# Patient Record
Sex: Female | Born: 1990 | Race: Asian | Hispanic: No | Marital: Single | Smoking: Never smoker
Health system: Southern US, Community
[De-identification: ages and names within clinical notes are randomized; demographics above are authoritative.]

## PROBLEM LIST (undated history)

## (undated) ENCOUNTER — Inpatient Hospital Stay (HOSPITAL_COMMUNITY): Payer: Self-pay

## (undated) DIAGNOSIS — D649 Anemia, unspecified: Secondary | ICD-10-CM

## (undated) HISTORY — PX: NO PAST SURGERIES: SHX2092

---

## 2013-07-09 NOTE — L&D Delivery Note (Signed)
Delivery Note At 12:55 AM a viable and healthy female was delivered via Vaginal, Spontaneous Delivery (Presentation: Left Occiput Anterior).  APGAR: 9, 9; weight: pending .   Placenta status: Intact, Spontaneous.  Cord: 3 vessels with the following complications: None.    Anesthesia: Epidural  Episiotomy: None Lacerations: None Suture Repair: N/A Est. Blood Loss (mL): 200  Mom to postpartum.  Baby to Couplet care / Skin to Skin.  Benjamin Stainhompson, McKenzie L, MD 06/30/2014, 1:16 AM  I have seen and examined this patient and I agree with the above. Cam HaiSHAW, Zuley Lutter CNM 9:51 AM 06/30/2014

## 2013-08-29 ENCOUNTER — Encounter (HOSPITAL_COMMUNITY): Payer: Self-pay | Admitting: *Deleted

## 2013-08-29 ENCOUNTER — Inpatient Hospital Stay (HOSPITAL_COMMUNITY)
Admission: AD | Admit: 2013-08-29 | Discharge: 2013-08-29 | Disposition: A | Discharge status: Home / Self Care | Payer: Self-pay | Source: Ambulatory Visit | Attending: Obstetrics & Gynecology | Admitting: Obstetrics & Gynecology

## 2013-08-29 DIAGNOSIS — N925 Other specified irregular menstruation: Secondary | ICD-10-CM | POA: Insufficient documentation

## 2013-08-29 DIAGNOSIS — N938 Other specified abnormal uterine and vaginal bleeding: Secondary | ICD-10-CM | POA: Insufficient documentation

## 2013-08-29 DIAGNOSIS — N939 Abnormal uterine and vaginal bleeding, unspecified: Secondary | ICD-10-CM

## 2013-08-29 DIAGNOSIS — D649 Anemia, unspecified: Secondary | ICD-10-CM

## 2013-08-29 DIAGNOSIS — N949 Unspecified condition associated with female genital organs and menstrual cycle: Secondary | ICD-10-CM | POA: Insufficient documentation

## 2013-08-29 DIAGNOSIS — N898 Other specified noninflammatory disorders of vagina: Secondary | ICD-10-CM

## 2013-08-29 HISTORY — DX: Anemia, unspecified: D64.9

## 2013-08-29 LAB — URINALYSIS, ROUTINE W REFLEX MICROSCOPIC
BILIRUBIN URINE: NEGATIVE
Glucose, UA: NEGATIVE mg/dL
Ketones, ur: NEGATIVE mg/dL
LEUKOCYTES UA: NEGATIVE
NITRITE: NEGATIVE
Protein, ur: NEGATIVE mg/dL
SPECIFIC GRAVITY, URINE: 1.02 (ref 1.005–1.030)
Urobilinogen, UA: 0.2 mg/dL (ref 0.0–1.0)
pH: 6 (ref 5.0–8.0)

## 2013-08-29 LAB — WET PREP, GENITAL
Clue Cells Wet Prep HPF POC: NONE SEEN
Trich, Wet Prep: NONE SEEN
Yeast Wet Prep HPF POC: NONE SEEN

## 2013-08-29 LAB — URINE MICROSCOPIC-ADD ON

## 2013-08-29 LAB — CBC
HCT: 30.3 % — ABNORMAL LOW (ref 36.0–46.0)
HEMOGLOBIN: 9.2 g/dL — AB (ref 12.0–15.0)
MCH: 21.6 pg — ABNORMAL LOW (ref 26.0–34.0)
MCHC: 30.4 g/dL (ref 30.0–36.0)
MCV: 71.1 fL — ABNORMAL LOW (ref 78.0–100.0)
Platelets: 302 10*3/uL (ref 150–400)
RBC: 4.26 MIL/uL (ref 3.87–5.11)
RDW: 16.1 % — ABNORMAL HIGH (ref 11.5–15.5)
WBC: 10.6 10*3/uL — ABNORMAL HIGH (ref 4.0–10.5)

## 2013-08-29 LAB — POCT PREGNANCY, URINE: PREG TEST UR: NEGATIVE

## 2013-08-29 MED ORDER — NORGESTIMATE-ETH ESTRADIOL 0.25-35 MG-MCG PO TABS: 1.0000 | ORAL_TABLET | Freq: Every day | ORAL | Status: DC

## 2013-08-29 NOTE — Discharge Instructions (Signed)
Abnormal Uterine Bleeding °Abnormal uterine bleeding means bleeding from the vagina that is not your normal menstrual period. This can be: °· Bleeding or spotting between periods. °· Bleeding after sex (sexual intercourse). °· Bleeding that is heavier or more than normal. °· Periods that last longer than usual. °· Bleeding after menopause. °There are many problems that may cause this. Treatment will depend on the cause of the bleeding. Any kind of bleeding that is not normal should be reviewed by your doctor.  °HOME CARE °Watch your condition for any changes. These actions may lessen any discomfort you are having: °· Do not use tampons or douches as told by your doctor. °· Change your pads often. °You should get regular pelvic exams and Pap tests. Keep all appointments for tests as told by your doctor. °GET HELP IF: °· You are bleeding for more than 1 week. °· You feel dizzy at times. °GET HELP RIGHT AWAY IF:  °· You pass out. °· You have to change pads every 15 to 30 minutes. °· You have belly pain. °· You have a fever. °· You become sweaty or weak. °· You are passing large blood clots from the vagina. °· You feel sick to your stomach (nauseous) and throw up (vomit). °MAKE SURE YOU: °· Understand these instructions. °· Will watch your condition. °· Will get help right away if you are not doing well or get worse. °Document Released: 04/22/2009 Document Revised: 04/15/2013 Document Reviewed: 01/22/2013 °ExitCare® Patient Information ©2014 [employer]. ° °Anemia, Nonspecific °Anemia is a condition in which the concentration of red blood cells or hemoglobin in the blood is below normal. Hemoglobin is a substance in red blood cells that carries oxygen to the tissues of the body. Anemia results in not enough oxygen reaching these tissues.  °CAUSES  °Common causes of anemia include:  °· Excessive bleeding. Bleeding may be internal or external. This includes excessive bleeding from periods (in women) or from the  intestine.   °· Poor nutrition.   °· Chronic kidney, thyroid, and liver disease.  °· Bone marrow disorders that decrease red blood cell production. °· Cancer and treatments for cancer. °· HIV, AIDS, and their treatments. °· Spleen problems that increase red blood cell destruction. °· Blood disorders. °· Excess destruction of red blood cells due to infection, medicines, and autoimmune disorders. °SIGNS AND SYMPTOMS  °· Minor weakness.   °· Dizziness.   °· Headache. °· Palpitations.   °· Shortness of breath, especially with exercise.   °· Paleness. °· Cold sensitivity. °· Indigestion. °· Nausea. °· Difficulty sleeping. °· Difficulty concentrating. °Symptoms may occur suddenly or they may develop slowly.  °DIAGNOSIS  °Additional blood tests are often needed. These help your health care provider determine the best treatment. Your health care provider will check your stool for blood and look for other causes of blood loss.  °TREATMENT  °Treatment varies depending on the cause of the anemia. Treatment can include:  °· Supplements of iron, vitamin B12, or folic acid.   °· Hormone medicines.   °· A blood transfusion. This may be needed if blood loss is severe.   °· Hospitalization. This may be needed if there is significant continual blood loss.   °· Dietary changes. °· Spleen removal. °HOME CARE INSTRUCTIONS °Keep all follow-up appointments. It often takes many weeks to correct anemia, and having your health care provider check on your condition and your response to treatment is very important. °SEEK IMMEDIATE MEDICAL CARE IF:  °· You develop extreme weakness, shortness of breath, or chest pain.   °· You become   dizzy or have trouble concentrating. °· You develop heavy vaginal bleeding.   °· You develop a rash.   °· You have bloody or black, tarry stools.   °· You faint.   °· You vomit up blood.   °· You vomit repeatedly.   °· You have abdominal pain. °· You have a fever or persistent symptoms for more than 2 3 days.    °· You have a fever and your symptoms suddenly get worse.   °· You are dehydrated.   °MAKE SURE YOU: °· Understand these instructions. °· Will watch your condition. °· Will get help right away if you are not doing well or get worse. °Document Released: 08/02/2004 Document Revised: 02/25/2013 Document Reviewed: 12/19/2012 °ExitCare® Patient Information ©2014 [employer]. ° °

## 2013-08-29 NOTE — MAU Note (Signed)
Patient presents with complaint of vaginal bleeding X 3 weeks.

## 2013-08-29 NOTE — MAU Provider Note (Signed)
Attestation of Attending Supervision of Advanced Practitioner (CNM/NP): Evaluation and management procedures were performed by the Advanced Practitioner under my supervision and collaboration.  I have reviewed the Advanced Practitioner's note and chart, and I agree with the management and plan.  HARRAWAY-SMITH, Stevin Bielinski 7:55 PM

## 2013-08-29 NOTE — MAU Provider Note (Signed)
History     CSN: [id number]  Arrival date and time: 08/29/13 1558   First Provider Initiated Contact with Patient 08/29/13 1723      Chief Complaint  Patient presents with  . Vaginal Bleeding   HPI  [name] is a 23 y.o. female G1P1001 who presents with abnormal vaginal bleeding. The bleeding started 3 weeks ago; one week before she was supposed to start her menstrual cycle and has not stopped.  The bleeding is very light; only using 2 panty liners per day. She has never experienced this problems before. She has never used anything for birth control, however desires birth control pills and is waiting to receive health insurance. Hx of anemia.    OB History   Grav Para Term Preterm Abortions TAB SAB Ect Mult Living   1 1 1       1       Past Medical History  Diagnosis Date  . Anemia     Past Surgical History  Procedure Laterality Date  . No past surgeries      History reviewed. No pertinent family history.  History  Substance Use Topics  . Smoking status: Never Smoker   . Smokeless tobacco: Never Used  . Alcohol Use: No    Allergies: No Known Allergies  No prescriptions prior to admission   Results for orders placed during the hospital encounter of 08/29/13 (from the past 48 hour(s))  URINALYSIS, ROUTINE W REFLEX MICROSCOPIC     Status: Abnormal   Collection Time    08/29/13  4:10 PM      Result Value Ref Range   Color, Urine YELLOW  YELLOW   APPearance CLEAR  CLEAR   Specific Gravity, Urine 1.020  1.005 - 1.030   pH 6.0  5.0 - 8.0   Glucose, UA NEGATIVE  NEGATIVE mg/dL   Hgb urine dipstick LARGE (*) NEGATIVE   Bilirubin Urine NEGATIVE  NEGATIVE   Ketones, ur NEGATIVE  NEGATIVE mg/dL   Protein, ur NEGATIVE  NEGATIVE mg/dL   Urobilinogen, UA 0.2  0.0 - 1.0 mg/dL   Nitrite NEGATIVE  NEGATIVE   Leukocytes, UA NEGATIVE  NEGATIVE  URINE MICROSCOPIC-ADD ON     Status: Abnormal   Collection Time    08/29/13  4:10 PM      Result Value Ref Range    Squamous Epithelial / LPF FEW (*) RARE   WBC, UA 0-2  <3 WBC/hpf   RBC / HPF 21-50  <3 RBC/hpf   Bacteria, UA FEW (*) RARE  POCT PREGNANCY, URINE     Status: None   Collection Time    08/29/13  4:49 PM      Result Value Ref Range   Preg Test, Ur NEGATIVE  NEGATIVE   Comment:            THE SENSITIVITY OF THIS     METHODOLOGY IS >24 mIU/mL  CBC     Status: Abnormal   Collection Time    08/29/13  4:56 PM      Result Value Ref Range   WBC 10.6 (*) 4.0 - 10.5 K/uL   RBC 4.26  3.87 - 5.11 MIL/uL   Hemoglobin 9.2 (*) 12.0 - 15.0 g/dL   HCT 40.9 (*) 81.1 - 91.4 %   MCV 71.1 (*) 78.0 - 100.0 fL   MCH 21.6 (*) 26.0 - 34.0 pg   MCHC 30.4  30.0 - 36.0 g/dL   RDW 78.2 (*) 95.6 - 21.3 %  Platelets 302  150 - 400 K/uL  WET PREP, GENITAL     Status: Abnormal   Collection Time    08/29/13  5:41 PM      Result Value Ref Range   Yeast Wet Prep HPF POC NONE SEEN  NONE SEEN   Trich, Wet Prep NONE SEEN  NONE SEEN   Clue Cells Wet Prep HPF POC NONE SEEN  NONE SEEN   WBC, Wet Prep HPF POC FEW (*) NONE SEEN   Comment: FEW BACTERIA SEEN       Review of Systems  Constitutional: Negative for fever and chills.  Gastrointestinal: Positive for abdominal pain (+ abdominal cramping ).  Genitourinary:       No vaginal discharge. + vaginal bleeding; dark red in color, small amount  No dysuria.    Physical Exam   Blood pressure 117/81, pulse 84, temperature 98.1 F (36.7 C), temperature source Oral, resp. rate 16, height 5\' 2"  (1.575 m), weight 56.756 kg (125 lb 2 oz), last menstrual period 07/12/2013.  Physical Exam  Constitutional: She is oriented to person, place, and time. She appears well-developed and well-nourished. No distress.  HENT:  Head: Normocephalic.  Eyes: Pupils are equal, round, and reactive to light.  Neck: Neck supple.  Respiratory: Effort normal.  GI: Soft. She exhibits no distension and no mass. There is no tenderness. There is no rebound and no guarding.   Genitourinary:  Speculum exam: Vagina - Small amount of dark red vaginal blood in canal. A few pea size clots noted  Cervix - Small active bleeding  Bimanual exam: Cervix closed, no CMT Uterus non tender, normal size Adnexa non tender, no masses bilaterally GC/Chlam, wet prep done Chaperone present for exam.   Musculoskeletal: Normal range of motion.  Neurological: She is alert and oriented to person, place, and time.  Skin: Skin is warm. She is not diaphoretic.  Psychiatric: Her behavior is normal.    MAU Course  Procedures None  MDM CBC Urine preg UA Wet prep GC Pt does not smoke, no history of DVT  Assessment and Plan   A:  1. Abnormal vaginal bleeding   2. Anemia     P: Discharge home in stable condition Follow up in the clinic in 3-6 months; referral sent  Start taking a daily vitamin with iron as directed on the bottle Bleeding precautions discussed Return to MAU as needed, if symptoms worsen RX: sprintec  Follow up in the clinic in 3-6 months for reevaluation of bleeding   Julie HansenJennifer Irene Nicklous Aburto, NP  08/29/2013, 6:12 PM

## 2013-08-31 LAB — GC/CHLAMYDIA PROBE AMP
CT Probe RNA: NEGATIVE
GC Probe RNA: NEGATIVE

## 2013-11-26 ENCOUNTER — Encounter: Payer: Self-pay | Admitting: Obstetrics & Gynecology

## 2013-11-26 ENCOUNTER — Telehealth: Payer: Self-pay | Admitting: General Practice

## 2013-11-26 ENCOUNTER — Encounter: Payer: Self-pay | Admitting: General Practice

## 2013-11-26 NOTE — Telephone Encounter (Signed)
Patient no showed for appt. Called patient, no answer- left message that we are trying to reach you because we see you missed your appt today, please give our front office staff a call to reschedule this appt. Will send letter

## 2014-01-18 ENCOUNTER — Inpatient Hospital Stay (HOSPITAL_COMMUNITY): Payer: Self-pay

## 2014-01-18 ENCOUNTER — Inpatient Hospital Stay (HOSPITAL_COMMUNITY)
Admission: AD | Admit: 2014-01-18 | Discharge: 2014-01-18 | Disposition: A | Discharge status: Home / Self Care | Payer: Self-pay | Source: Ambulatory Visit | Attending: Family Medicine | Admitting: Family Medicine

## 2014-01-18 ENCOUNTER — Encounter (HOSPITAL_COMMUNITY): Payer: Self-pay | Admitting: *Deleted

## 2014-01-18 DIAGNOSIS — M62838 Other muscle spasm: Secondary | ICD-10-CM | POA: Insufficient documentation

## 2014-01-18 DIAGNOSIS — O0930 Supervision of pregnancy with insufficient antenatal care, unspecified trimester: Secondary | ICD-10-CM | POA: Insufficient documentation

## 2014-01-18 DIAGNOSIS — Z3490 Encounter for supervision of normal pregnancy, unspecified, unspecified trimester: Secondary | ICD-10-CM

## 2014-01-18 DIAGNOSIS — O9989 Other specified diseases and conditions complicating pregnancy, childbirth and the puerperium: Principal | ICD-10-CM

## 2014-01-18 DIAGNOSIS — O99891 Other specified diseases and conditions complicating pregnancy: Secondary | ICD-10-CM | POA: Insufficient documentation

## 2014-01-18 DIAGNOSIS — O99019 Anemia complicating pregnancy, unspecified trimester: Secondary | ICD-10-CM | POA: Insufficient documentation

## 2014-01-18 DIAGNOSIS — Y92410 Unspecified street and highway as the place of occurrence of the external cause: Secondary | ICD-10-CM | POA: Insufficient documentation

## 2014-01-18 DIAGNOSIS — D649 Anemia, unspecified: Secondary | ICD-10-CM | POA: Insufficient documentation

## 2014-01-18 LAB — WET PREP, GENITAL
TRICH WET PREP: NONE SEEN
YEAST WET PREP: NONE SEEN

## 2014-01-18 MED ORDER — CYCLOBENZAPRINE HCL 10 MG PO TABS: 10.0000 mg | ORAL_TABLET | Freq: Two times a day (BID) | ORAL | Status: DC | PRN
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
## 2014-01-18 NOTE — Discharge Instructions (Signed)

## 2014-01-19 LAB — GC/CHLAMYDIA PROBE AMP
CT PROBE, AMP APTIMA: NEGATIVE
GC Probe RNA: NEGATIVE

## 2014-01-20 NOTE — MAU Provider Note (Signed)
Attestation of Attending Supervision of Advanced Practitioner (PA/CNM/NP): Evaluation and management procedures were performed by the Advanced Practitioner under my supervision and collaboration.  I have reviewed the Advanced Practitioner's note and chart, and I agree with the management and plan.  Faydra Korman S, MD [facility] Attending 01/20/2014 10:13 AM   

## 2014-02-12 ENCOUNTER — Encounter (HOSPITAL_COMMUNITY): Payer: Self-pay | Admitting: *Deleted

## 2014-02-12 ENCOUNTER — Inpatient Hospital Stay (HOSPITAL_COMMUNITY)
Admission: AD | Admit: 2014-02-12 | Discharge: 2014-02-12 | Disposition: A | Discharge status: Home / Self Care | Payer: Self-pay | Source: Ambulatory Visit | Attending: Obstetrics & Gynecology | Admitting: Obstetrics & Gynecology

## 2014-02-12 DIAGNOSIS — R21 Rash and other nonspecific skin eruption: Secondary | ICD-10-CM | POA: Insufficient documentation

## 2014-02-12 DIAGNOSIS — R109 Unspecified abdominal pain: Secondary | ICD-10-CM | POA: Insufficient documentation

## 2014-02-12 DIAGNOSIS — O99891 Other specified diseases and conditions complicating pregnancy: Secondary | ICD-10-CM | POA: Insufficient documentation

## 2014-02-12 DIAGNOSIS — O9989 Other specified diseases and conditions complicating pregnancy, childbirth and the puerperium: Principal | ICD-10-CM

## 2014-02-12 DIAGNOSIS — R1032 Left lower quadrant pain: Secondary | ICD-10-CM

## 2014-02-12 DIAGNOSIS — R1031 Right lower quadrant pain: Secondary | ICD-10-CM

## 2014-02-12 LAB — URINALYSIS, ROUTINE W REFLEX MICROSCOPIC
BILIRUBIN URINE: NEGATIVE
Glucose, UA: NEGATIVE mg/dL
HGB URINE DIPSTICK: NEGATIVE
Ketones, ur: NEGATIVE mg/dL
Nitrite: NEGATIVE
PROTEIN: NEGATIVE mg/dL
Specific Gravity, Urine: 1.02 (ref 1.005–1.030)
Urobilinogen, UA: 0.2 mg/dL (ref 0.0–1.0)
pH: 6 (ref 5.0–8.0)

## 2014-02-12 LAB — URINE MICROSCOPIC-ADD ON

## 2014-02-12 NOTE — Progress Notes (Signed)
Jennifer Rasch NP in earlier to discuss test results and d/c plan. WRitten and verbal d/c instructions given and understanding voiced. 

## 2014-02-12 NOTE — Discharge Instructions (Signed)
Abdominal Pain During Pregnancy Abdominal pain is common in pregnancy. Most of the time, it does not cause harm. There are many causes of abdominal pain. Some causes are more serious than others. Some of the causes of abdominal pain in pregnancy are easily diagnosed. Occasionally, the diagnosis takes time to understand. Other times, the cause is not determined. Abdominal pain can be a sign that something is very wrong with the pregnancy, or the pain may have nothing to do with the pregnancy at all. For this reason, always tell your health care provider if you have any abdominal discomfort. HOME CARE INSTRUCTIONS  Monitor your abdominal pain for any changes. The following actions may help to alleviate any discomfort you are experiencing:  Do not have sexual intercourse or put anything in your vagina until your symptoms go away completely.  Get plenty of rest until your pain improves.  Drink clear fluids if you feel nauseous. Avoid solid food as long as you are uncomfortable or nauseous.  Only take over-the-counter or prescription medicine as directed by your health care provider.  Keep all follow-up appointments with your health care provider. SEEK IMMEDIATE MEDICAL CARE IF:  You are bleeding, leaking fluid, or passing tissue from the vagina.  You have increasing pain or cramping.  You have persistent vomiting.  You have painful or bloody urination.  You have a fever.  You notice a decrease in your baby's movements.  You have extreme weakness or feel faint.  You have shortness of breath, with or without abdominal pain.  You develop a severe headache with abdominal pain.  You have abnormal vaginal discharge with abdominal pain.  You have persistent diarrhea.  You have abdominal pain that continues even after rest, or gets worse. MAKE SURE YOU:   Understand these instructions.  Will watch your condition.  Will get help right away if you are not doing well or get  worse. Document Released: 06/25/2005 Document Revised: 04/15/2013 Document Reviewed: 01/22/2013 [facility] Patient Information 2015 [place], [place]. This information is not intended to replace advice given to you by your health care provider. Make sure you discuss any questions you have with your health care provider.  For assistance with applying for medicaid please call Fausto SkillernReyna South [phone] or Anne HahnJuanita McCraw [phone].

## 2014-02-12 NOTE — MAU Provider Note (Signed)
History     CSN: [id number]  Arrival date and time: 02/12/14 1950   None     Chief Complaint  Patient presents with  . Groin Pain  . Numbness   HPI  Julie Strong is a 23 y.o. female G2P1001 at 1422w0d who presents with complaints of bilateral groin pain; she notices the pain more at night when she is trying to go to sleep. She currently denies pain. She denies difficulty walking or changing positions.  Pt also complains of a rash that is under her arms; the rash is red and is itchy. She first noticed the rash 1 week ago. She feels like her body temperature has changed since she became pregnant and feels like she becomes hot and diaphoretic at night.  She has not started prenatal care because she does not have transportation.  Pt desires ultrasound today because she has not had care for the baby.   OB History   Grav Para Term Preterm Abortions TAB SAB Ect Mult Living   2 1 1       1       Past Medical History  Diagnosis Date  . Anemia     Past Surgical History  Procedure Laterality Date  . No past surgeries      Family History  Problem Relation Age of Onset  . Alcohol abuse Neg Hx     History  Substance Use Topics  . Smoking status: Never Smoker   . Smokeless tobacco: Never Used  . Alcohol Use: No    Allergies: No Known Allergies  Prescriptions prior to admission  Medication Sig Dispense Refill  . Prenatal Vit-Fe Fumarate-FA (PRENATAL MULTIVITAMIN) TABS tablet Take 1 tablet by mouth daily.        Results for orders placed during the hospital encounter of 02/12/14 (from the past 48 hour(s))  URINALYSIS, ROUTINE W REFLEX MICROSCOPIC     Status: Abnormal   Collection Time    02/12/14  8:20 PM      Result Value Ref Range   Color, Urine YELLOW  YELLOW   APPearance CLEAR  CLEAR   Specific Gravity, Urine 1.020  1.005 - 1.030   pH 6.0  5.0 - 8.0   Glucose, UA NEGATIVE  NEGATIVE mg/dL   Hgb urine dipstick NEGATIVE  NEGATIVE   Bilirubin Urine NEGATIVE  NEGATIVE    Ketones, ur NEGATIVE  NEGATIVE mg/dL   Protein, ur NEGATIVE  NEGATIVE mg/dL   Urobilinogen, UA 0.2  0.0 - 1.0 mg/dL   Nitrite NEGATIVE  NEGATIVE   Leukocytes, UA MODERATE (*) NEGATIVE  URINE MICROSCOPIC-ADD ON     Status: Abnormal   Collection Time    02/12/14  8:20 PM      Result Value Ref Range   Squamous Epithelial / LPF FEW (*) RARE   WBC, UA 7-10  <3 WBC/hpf   RBC / HPF 0-2  <3 RBC/hpf   Bacteria, UA FEW (*) RARE    Review of Systems  Constitutional: Negative for fever and chills.  Eyes: Negative for blurred vision.  Cardiovascular: Negative for chest pain.  Gastrointestinal: Positive for constipation. Negative for nausea, vomiting, abdominal pain and diarrhea.  Genitourinary: Negative for dysuria, urgency, frequency and hematuria.       Denies vaginal bleeding   Skin: Positive for rash.       Under both arms "in armpit area"  Neurological: Negative for headaches.   Physical Exam   Blood pressure 110/65, pulse 69, temperature 98.8 F (37.1  C), resp. rate 18, height 5\' 1"  (1.549 m), weight 60.419 kg (133 lb 3.2 oz), last menstrual period 07/12/2013.  Physical Exam  Constitutional: She is oriented to person, place, and time. She appears well-developed and well-nourished. No distress.  HENT:  Head: Normocephalic.  Eyes: Pupils are equal, round, and reactive to light.  Neck: Neck supple.  Cardiovascular: Intact distal pulses.   Respiratory: Effort normal.  GI: Soft. Normal appearance. There is no tenderness.  Musculoskeletal: Normal range of motion. She exhibits no edema and no tenderness.  Neurological: She is alert and oriented to person, place, and time. She has normal strength and normal reflexes. GCS eye subscore is 4. GCS verbal subscore is 5. GCS motor subscore is 6.  Skin: Skin is warm and dry. Rash (Red, non papular rash under bilateral arm pit areas. Non tender to touch ) noted. She is not diaphoretic. There is erythema.       MAU Course   Procedures None  MDM + Fht's  UA   Assessment and Plan   A:  1. Rash   2. Bilateral groin pain; likely round ligament pain    P:  Discharge home in stable condition  Abdominal support belt recommended 1% hydrocortisone cream to affected area as directed on the bottle.  Start prenatal care ASAP; Clinic phone number provided to the patient  Return to MAU if symptoms worsen Heat/ cold to groin sites.   Iona Hansen Rasch, NP  02/17/2014, 3:28 PM

## 2014-02-12 NOTE — MAU Note (Signed)
Pain in groin and causes numbness in legs. Get really hot at night and have rash on legs and upper arms. Some on sides. Sometimes rash itches. Rash present arms and legs for a wk

## 2014-02-15 ENCOUNTER — Inpatient Hospital Stay (HOSPITAL_COMMUNITY)
Admission: AD | Admit: 2014-02-15 | Discharge: 2014-02-15 | Disposition: A | Discharge status: Home / Self Care | Payer: Self-pay | Source: Ambulatory Visit | Attending: Obstetrics & Gynecology | Admitting: Obstetrics & Gynecology

## 2014-02-15 ENCOUNTER — Encounter (HOSPITAL_COMMUNITY): Payer: Self-pay | Admitting: General Practice

## 2014-02-15 DIAGNOSIS — O2390 Unspecified genitourinary tract infection in pregnancy, unspecified trimester: Secondary | ICD-10-CM | POA: Insufficient documentation

## 2014-02-15 DIAGNOSIS — B9689 Other specified bacterial agents as the cause of diseases classified elsewhere: Secondary | ICD-10-CM | POA: Insufficient documentation

## 2014-02-15 DIAGNOSIS — M549 Dorsalgia, unspecified: Secondary | ICD-10-CM | POA: Insufficient documentation

## 2014-02-15 DIAGNOSIS — A499 Bacterial infection, unspecified: Secondary | ICD-10-CM

## 2014-02-15 DIAGNOSIS — O209 Hemorrhage in early pregnancy, unspecified: Secondary | ICD-10-CM | POA: Insufficient documentation

## 2014-02-15 DIAGNOSIS — R109 Unspecified abdominal pain: Secondary | ICD-10-CM | POA: Insufficient documentation

## 2014-02-15 DIAGNOSIS — Z3482 Encounter for supervision of other normal pregnancy, second trimester: Secondary | ICD-10-CM

## 2014-02-15 DIAGNOSIS — N760 Acute vaginitis: Secondary | ICD-10-CM

## 2014-02-15 LAB — URINALYSIS, ROUTINE W REFLEX MICROSCOPIC
Bilirubin Urine: NEGATIVE
Glucose, UA: NEGATIVE mg/dL
Hgb urine dipstick: NEGATIVE
Ketones, ur: NEGATIVE mg/dL
LEUKOCYTES UA: NEGATIVE
NITRITE: NEGATIVE
PROTEIN: NEGATIVE mg/dL
Specific Gravity, Urine: 1.015 (ref 1.005–1.030)
Urobilinogen, UA: 0.2 mg/dL (ref 0.0–1.0)
pH: 7 (ref 5.0–8.0)

## 2014-02-15 LAB — WET PREP, GENITAL
Trich, Wet Prep: NONE SEEN
Yeast Wet Prep HPF POC: NONE SEEN

## 2014-02-15 MED ORDER — METRONIDAZOLE 500 MG PO TABS: 500.0000 mg | ORAL_TABLET | Freq: Two times a day (BID) | ORAL | Status: DC

## 2014-02-15 NOTE — Discharge Instructions (Signed)
Bacterial Vaginosis Bacterial vaginosis is a vaginal infection that occurs when the normal balance of bacteria in the vagina is disrupted. It results from an overgrowth of certain bacteria. This is the most common vaginal infection in women of childbearing age. Treatment is important to prevent complications, especially in pregnant women, as it can cause a premature delivery. CAUSES  Bacterial vaginosis is caused by an increase in harmful bacteria that are normally present in smaller amounts in the vagina. Several different kinds of bacteria can cause bacterial vaginosis. However, the reason that the condition develops is not fully understood. RISK FACTORS Certain activities or behaviors can put you at an increased risk of developing bacterial vaginosis, including:  Having a new sex partner or multiple sex partners.  Douching.  Using an intrauterine device (IUD) for contraception. Women do not get bacterial vaginosis from toilet seats, bedding, swimming pools, or contact with objects around them. SIGNS AND SYMPTOMS  Some women with bacterial vaginosis have no signs or symptoms. Common symptoms include:  Grey vaginal discharge.  A fishlike odor with discharge, especially after sexual intercourse.  Itching or burning of the vagina and vulva.  Burning or pain with urination. DIAGNOSIS  Your health care provider will take a medical history and examine the vagina for signs of bacterial vaginosis. A sample of vaginal fluid may be taken. Your health care provider will look at this sample under a microscope to check for bacteria and abnormal cells. A vaginal pH test may also be done.  TREATMENT  Bacterial vaginosis may be treated with antibiotic medicines. These may be given in the form of a pill or a vaginal cream. A second round of antibiotics may be prescribed if the condition comes back after treatment.  HOME CARE INSTRUCTIONS   Only take over-the-counter or prescription medicines as  directed by your health care provider.  If antibiotic medicine was prescribed, take it as directed. Make sure you finish it even if you start to feel better.  Do not have sex until treatment is completed.  Tell all sexual partners that you have a vaginal infection. They should see their health care provider and be treated if they have problems, such as a mild rash or itching.  Practice safe sex by using condoms and only having one sex partner. SEEK MEDICAL CARE IF:   Your symptoms are not improving after 3 days of treatment.  You have increased discharge or pain.  You have a fever. MAKE SURE YOU:   Understand these instructions.  Will watch your condition.  Will get help right away if you are not doing well or get worse. FOR MORE INFORMATION  Centers for Disease Control and Prevention, Division of STD Prevention: www.cdc.gov/std American Sexual Health Association (ASHA): www.ashastd.org  Document Released: 06/25/2005 Document Revised: 04/15/2013 Document Reviewed: 02/04/2013 ExitCare Patient Information 2015 [employer]. This information is not intended to replace advice given to you by your health care provider. Make sure you discuss any questions you have with your health care provider.  

## 2014-02-15 NOTE — MAU Note (Signed)
Lower back pain continues, very severe.  "Numbing pain" in lower abd & down legs.  Bleeding started this a.m., none now.

## 2014-02-15 NOTE — MAU Provider Note (Signed)
Chief Complaint:  Abdominal Pain, Back Pain and Vaginal Bleeding   First Provider Initiated Contact with Patient 02/15/14 1910      HPI: Julie Strong is a 23 y.o. G2P1001 at [redacted]w[redacted]d who presents to maternity admissions reporting vaginal bleeding and pelvic pain. Seen in MAU on 8/7 complaining of severe pelvic pain in bilateral hips and into groin. Reports worse w/ movement. Helped w/ yoga and hot showers. Has not improved since d/c from MAU, although she was told it was normal pregnancy pains, "just her uterus growing." Denies flank pain, dysuria, fevers, chills.  Also reports this AM she noted a small amount of bright red blood on the sponge when she was washing herself. Believes it came from vagina. Put on toilet tissue in her underwear after showering and noted a couple of drops of bright red blood on pad. States this only happened once, has not seen blood in > 6 hours.  Reports + dark yellow discharge. No vaginal itching or pain. Did have intercourse last PM.  Denies contractions, leakage of fluid. Good fetal movement.   Pregnancy Course:  Uncompicated  Past Medical History: Past Medical History  Diagnosis Date  . Anemia     Past obstetric history: OB History  Gravida Para Term Preterm AB SAB TAB Ectopic Multiple Living  2 1 1       1     # Outcome Date GA Lbr Len/2nd Weight Sex Delivery Anes PTL Lv  2 CUR           1 TRM 02/16/12    M SVD EPI  Y      Past Surgical History: Past Surgical History  Procedure Laterality Date  . No past surgeries       Family History: Family History  Problem Relation Age of Onset  . Alcohol abuse Neg Hx     Social History: History  Substance Use Topics  . Smoking status: Never Smoker   . Smokeless tobacco: Never Used  . Alcohol Use: No    Allergies: No Known Allergies  Meds:  No prescriptions prior to admission    ROS: Pertinent findings in history of present illness.  Physical Exam  Blood pressure 112/56, pulse 70,  temperature 98.7 F (37.1 C), temperature source Oral, resp. rate 20, last menstrual period 07/12/2013. GENERAL: Well-developed, well-nourished female in no acute distress.  HEENT: normocephalic HEART: normal rate RESP: normal effort ABDOMEN: Soft, non-tender, gravid appropriate for gestational age EXTREMITIES: Nontender, no edema NEURO: alert and oriented SPECULUM EXAM: NEFG, physiologic discharge, no blood, cervix clean    FHT:  Baseline 130s , moderate variability, accelerations present, no decelerations Contractions: quiet   Labs: Results for orders placed during the hospital encounter of 02/15/14 (from the past 24 hour(s))  URINALYSIS, ROUTINE W REFLEX MICROSCOPIC     Status: None   Collection Time    02/15/14  5:55 PM      Result Value Ref Range   Color, Urine YELLOW  YELLOW   APPearance CLEAR  CLEAR   Specific Gravity, Urine 1.015  1.005 - 1.030   pH 7.0  5.0 - 8.0   Glucose, UA NEGATIVE  NEGATIVE mg/dL   Hgb urine dipstick NEGATIVE  NEGATIVE   Bilirubin Urine NEGATIVE  NEGATIVE   Ketones, ur NEGATIVE  NEGATIVE mg/dL   Protein, ur NEGATIVE  NEGATIVE mg/dL   Urobilinogen, UA 0.2  0.0 - 1.0 mg/dL   Nitrite NEGATIVE  NEGATIVE   Leukocytes, UA NEGATIVE  NEGATIVE  WET PREP, GENITAL  Status: Abnormal   Collection Time    02/15/14  7:40 PM      Result Value Ref Range   Yeast Wet Prep HPF POC NONE SEEN  NONE SEEN   Trich, Wet Prep NONE SEEN  NONE SEEN   Clue Cells Wet Prep HPF POC FEW (*) NONE SEEN   WBC, Wet Prep HPF POC MODERATE (*) NONE SEEN    Imaging:  [facility]  01/19/2014   OBSTETRICAL ULTRASOUND: This exam was performed within a [facility]. The OB US report was generated in the AS system, and faxed to the ordering physician.   This report is available in the [facility] PACS. See the [facility] Obstetric US report via the Image Link.  MAU Course:  Pt seen in MAU for pelvic pain and vaginal bleeding. On SVE, no blood noted, with mildly friable  cervix. GC/CT collected, pending. Wet prep + for BV. Likely cervical irritation 2/2 BV and recent intercourse. Reassuring no active bleeding, no evidence of abruption.  Re: LBP and pelvic pain, likely round ligament pain and SI joint pain. Reassured. Discussed tylenol, stretching, ice/heat prn.  Will order 2nd trimester anatomy scan per pt request as well as send note for scheduling in clinic as pt reports she was told  [facility] not accepting new pts.  Assessment: 1. Bacterial vaginosis   2. Encounter for supervision of other normal pregnancy in second trimester     Plan: Discharge home PTL precautions  Vaginal Bleeding precautions       Follow-up Information   Follow up In 2 days. (As needed, If symptoms worsen)        Medication List         metroNIDAZOLE 500 MG tablet  Commonly known as:  FLAGYL  Take 1 tablet (500 mg total) by mouth 2 (two) times daily.     prenatal multivitamin Tabs tablet  Take 1 tablet by mouth daily.        Ethelda Chickaroline Heloise Gordan, MD 02/15/2014 8:55 PM

## 2014-02-16 LAB — GC/CHLAMYDIA PROBE AMP
CT Probe RNA: NEGATIVE
GC Probe RNA: NEGATIVE

## 2014-02-17 NOTE — MAU Provider Note (Signed)
Attestation of Attending Supervision of Advanced Practitioner (CNM/NP): Evaluation and management procedures were performed by the Advanced Practitioner under my supervision and collaboration. I have reviewed the Advanced Practitioner's note and chart, and I agree with the management and plan.  LEGGETT,KELLY H. 4:05 PM

## 2014-02-18 ENCOUNTER — Telehealth: Payer: Self-pay | Admitting: *Deleted

## 2014-02-18 DIAGNOSIS — Z3492 Encounter for supervision of normal pregnancy, unspecified, second trimester: Secondary | ICD-10-CM

## 2014-02-18 NOTE — Telephone Encounter (Signed)
Patient called nurse line wanting to schedule an ultrasound appointment.  Attempted to contact patient, no answer, left message for patient to call nurse line and leave time of day that would be best for her ultrasound (anatomy) and we would schedule appointment and call to notify her of the date/time.  Order for ultrasound is in the system./

## 2014-02-19 ENCOUNTER — Encounter: Payer: Self-pay | Admitting: Advanced Practice Midwife

## 2014-02-22 NOTE — Telephone Encounter (Signed)
Called patient stating I am returning your phone call and informed her of 8/25 ultrasound appt. Patient verbalized understanding and asked for sooner appt. Rescheduled appt for 8/19 @ 1:30. Informed patient of new time/date. Patient verbalized understanding and had no questions

## 2014-02-24 ENCOUNTER — Ambulatory Visit (HOSPITAL_COMMUNITY)
Admission: RE | Admit: 2014-02-24 | Discharge: 2014-02-24 | Disposition: A | Discharge status: Home / Self Care | Payer: Self-pay | Source: Ambulatory Visit | Attending: Advanced Practice Midwife | Admitting: Advanced Practice Midwife

## 2014-02-24 ENCOUNTER — Encounter: Payer: Self-pay | Admitting: Advanced Practice Midwife

## 2014-02-24 DIAGNOSIS — O0932 Supervision of pregnancy with insufficient antenatal care, second trimester: Secondary | ICD-10-CM | POA: Insufficient documentation

## 2014-02-24 DIAGNOSIS — Z3689 Encounter for other specified antenatal screening: Secondary | ICD-10-CM | POA: Insufficient documentation

## 2014-02-24 DIAGNOSIS — Z3492 Encounter for supervision of normal pregnancy, unspecified, second trimester: Secondary | ICD-10-CM

## 2014-03-02 ENCOUNTER — Ambulatory Visit (HOSPITAL_COMMUNITY): Payer: No Typology Code available for payment source

## 2014-03-09 ENCOUNTER — Encounter: Payer: Self-pay | Admitting: Advanced Practice Midwife

## 2014-03-10 ENCOUNTER — Encounter: Payer: Self-pay | Admitting: General Practice

## 2014-03-18 ENCOUNTER — Encounter: Payer: Self-pay | Admitting: Physician Assistant

## 2014-05-07 ENCOUNTER — Encounter: Payer: Self-pay | Admitting: General Practice

## 2014-05-10 ENCOUNTER — Encounter (HOSPITAL_COMMUNITY): Payer: Self-pay | Admitting: General Practice

## 2014-05-26 ENCOUNTER — Inpatient Hospital Stay (HOSPITAL_COMMUNITY)
Admission: AD | Admit: 2014-05-26 | Discharge: 2014-05-26 | Disposition: A | Discharge status: Home / Self Care | Payer: Self-pay | Source: Ambulatory Visit | Attending: Family Medicine | Admitting: Family Medicine

## 2014-05-26 ENCOUNTER — Encounter (HOSPITAL_COMMUNITY): Payer: Self-pay

## 2014-05-26 DIAGNOSIS — O0933 Supervision of pregnancy with insufficient antenatal care, third trimester: Secondary | ICD-10-CM

## 2014-05-26 DIAGNOSIS — O99512 Diseases of the respiratory system complicating pregnancy, second trimester: Secondary | ICD-10-CM | POA: Insufficient documentation

## 2014-05-26 DIAGNOSIS — J069 Acute upper respiratory infection, unspecified: Secondary | ICD-10-CM | POA: Insufficient documentation

## 2014-05-26 DIAGNOSIS — O0932 Supervision of pregnancy with insufficient antenatal care, second trimester: Secondary | ICD-10-CM | POA: Insufficient documentation

## 2014-05-26 DIAGNOSIS — Z3A21 21 weeks gestation of pregnancy: Secondary | ICD-10-CM | POA: Insufficient documentation

## 2014-05-26 LAB — CBC
HCT: 29.8 % — ABNORMAL LOW (ref 36.0–46.0)
Hemoglobin: 9.4 g/dL — ABNORMAL LOW (ref 12.0–15.0)
MCH: 23 pg — AB (ref 26.0–34.0)
MCHC: 31.5 g/dL (ref 30.0–36.0)
MCV: 73 fL — AB (ref 78.0–100.0)
PLATELETS: 226 10*3/uL (ref 150–400)
RBC: 4.08 MIL/uL (ref 3.87–5.11)
RDW: 15.9 % — ABNORMAL HIGH (ref 11.5–15.5)
WBC: 7.6 10*3/uL (ref 4.0–10.5)

## 2014-05-26 LAB — URINE MICROSCOPIC-ADD ON

## 2014-05-26 LAB — DIFFERENTIAL
BASOS PCT: 0 % (ref 0–1)
Basophils Absolute: 0 10*3/uL (ref 0.0–0.1)
EOS ABS: 0.2 10*3/uL (ref 0.0–0.7)
EOS PCT: 2 % (ref 0–5)
LYMPHS ABS: 1.7 10*3/uL (ref 0.7–4.0)
Lymphocytes Relative: 22 % (ref 12–46)
Monocytes Absolute: 0.4 10*3/uL (ref 0.1–1.0)
Monocytes Relative: 5 % (ref 3–12)
Neutro Abs: 5.4 10*3/uL (ref 1.7–7.7)
Neutrophils Relative %: 71 % (ref 43–77)

## 2014-05-26 LAB — RAPID STREP SCREEN (MED CTR MEBANE ONLY): Streptococcus, Group A Screen (Direct): NEGATIVE

## 2014-05-26 LAB — ABO/RH: ABO/RH(D): A POS

## 2014-05-26 LAB — URINALYSIS, ROUTINE W REFLEX MICROSCOPIC
GLUCOSE, UA: NEGATIVE mg/dL
Hgb urine dipstick: NEGATIVE
Ketones, ur: 15 mg/dL — AB
Nitrite: NEGATIVE
PH: 6 (ref 5.0–8.0)
Protein, ur: 30 mg/dL — AB
Specific Gravity, Urine: >1.03 — ABNORMAL HIGH (ref 1.005–1.030)
Urobilinogen, UA: 1 mg/dL (ref 0.0–1.0)

## 2014-05-26 LAB — RAPID URINE DRUG SCREEN, HOSP PERFORMED
Amphetamines: NOT DETECTED
Barbiturates: NOT DETECTED
Benzodiazepines: NOT DETECTED
Cocaine: NOT DETECTED
OPIATES: NOT DETECTED
Tetrahydrocannabinol: NOT DETECTED

## 2014-05-26 LAB — HIV ANTIBODY (ROUTINE TESTING W REFLEX): HIV: NONREACTIVE

## 2014-05-26 LAB — OB RESULTS CONSOLE GBS: GBS: POSITIVE

## 2014-05-26 LAB — RPR

## 2014-05-26 LAB — POCT PREGNANCY, URINE: Preg Test, Ur: POSITIVE — AB

## 2014-05-26 LAB — RAPID HIV SCREEN (WH-MAU): SUDS RAPID HIV SCREEN: NONREACTIVE

## 2014-05-26 NOTE — Discharge Instructions (Signed)
Pharyngitis Pharyngitis is redness, pain, and swelling (inflammation) of your pharynx.  CAUSES  Pharyngitis is usually caused by infection. Most of the time, these infections are from viruses (viral) and are part of a cold. However, sometimes pharyngitis is caused by bacteria (bacterial). Pharyngitis can also be caused by allergies. Viral pharyngitis may be spread from person to person by coughing, sneezing, and personal items or utensils (cups, forks, spoons, toothbrushes). Bacterial pharyngitis may be spread from person to person by more intimate contact, such as kissing.  SIGNS AND SYMPTOMS  Symptoms of pharyngitis include:   Sore throat.   Tiredness (fatigue).   Low-grade fever.   Headache.  Joint pain and muscle aches.  Skin rashes.  Swollen lymph nodes.  Plaque-like film on throat or tonsils (often seen with bacterial pharyngitis). DIAGNOSIS  Your health care provider will ask you questions about your illness and your symptoms. Your medical history, along with a physical exam, is often all that is needed to diagnose pharyngitis. Sometimes, a rapid strep test is done. Other lab tests may also be done, depending on the suspected cause.  TREATMENT  Viral pharyngitis will usually get better in 3-4 days without the use of medicine. Bacterial pharyngitis is treated with medicines that kill germs (antibiotics).  HOME CARE INSTRUCTIONS   Drink enough water and fluids to keep your urine clear or pale yellow.   Only take over-the-counter or prescription medicines as directed by your health care provider:   If you are prescribed antibiotics, make sure you finish them even if you start to feel better.   Do not take aspirin.   Get lots of rest.   Gargle with 8 oz of salt water ( tsp of salt per 1 qt of water) as often as every 1-2 hours to soothe your throat.   Throat lozenges (if you are not at risk for choking) or sprays may be used to soothe your throat. SEEK MEDICAL  CARE IF:   You have large, tender lumps in your neck.  You have a rash.  You cough up green, yellow-brown, or bloody spit. SEEK IMMEDIATE MEDICAL CARE IF:   Your neck becomes stiff.  You drool or are unable to swallow liquids.  You vomit or are unable to keep medicines or liquids down.  You have severe pain that does not go away with the use of recommended medicines.  You have trouble breathing (not caused by a stuffy nose). MAKE SURE YOU:   Understand these instructions.  Will watch your condition.  Will get help right away if you are not doing well or get worse. Document Released: 06/25/2005 Document Revised: 04/15/2013 Document Reviewed: 03/02/2013 [facility] Patient Information 2015 [place], [place]. This information is not intended to replace advice given to you by your health care provider. Make sure you discuss any questions you have with your health care provider. Upper Respiratory Infection, Adult An upper respiratory infection (URI) is also sometimes known as the common cold. The upper respiratory tract includes the nose, sinuses, throat, trachea, and bronchi. Bronchi are the airways leading to the lungs. Most people improve within 1 week, but symptoms can last up to 2 weeks. A residual cough may last even longer.  CAUSES Many different viruses can infect the tissues lining the upper respiratory tract. The tissues become irritated and inflamed and often become very moist. Mucus production is also common. A cold is contagious. You can easily spread the virus to others by oral contact. This includes kissing, sharing a glass, coughing,  or sneezing. Touching your mouth or nose and then touching a surface, which is then touched by another person, can also spread the virus. SYMPTOMS  Symptoms typically develop 1 to 3 days after you come in contact with a cold virus. Symptoms vary from person to person. They may include:  Runny nose.  Sneezing.  Nasal congestion.  Sinus  irritation.  Sore throat.  Loss of voice (laryngitis).  Cough.  Fatigue.  Muscle aches.  Loss of appetite.  Headache.  Low-grade fever. DIAGNOSIS  You might diagnose your own cold based on familiar symptoms, since most people get a cold 2 to 3 times a year. Your caregiver can confirm this based on your exam. Most importantly, your caregiver can check that your symptoms are not due to another disease such as strep throat, sinusitis, pneumonia, asthma, or epiglottitis. Blood tests, throat tests, and X-rays are not necessary to diagnose a common cold, but they may sometimes be helpful in excluding other more serious diseases. Your caregiver will decide if any further tests are required. RISKS AND COMPLICATIONS  You may be at risk for a more severe case of the common cold if you smoke cigarettes, have chronic heart disease (such as heart failure) or lung disease (such as asthma), or if you have a weakened immune system. The very young and very old are also at risk for more serious infections. Bacterial sinusitis, middle ear infections, and bacterial pneumonia can complicate the common cold. The common cold can worsen asthma and chronic obstructive pulmonary disease (COPD). Sometimes, these complications can require emergency medical care and may be life-threatening. PREVENTION  The best way to protect against getting a cold is to practice good hygiene. Avoid oral or hand contact with people with cold symptoms. Wash your hands often if contact occurs. There is no clear evidence that vitamin C, vitamin E, echinacea, or exercise reduces the chance of developing a cold. However, it is always recommended to get plenty of rest and practice good nutrition. TREATMENT  Treatment is directed at relieving symptoms. There is no cure. Antibiotics are not effective, because the infection is caused by a virus, not by bacteria. Treatment may include:  Increased fluid intake. Sports drinks offer valuable  electrolytes, sugars, and fluids.  Breathing heated mist or steam (vaporizer or shower).  Eating chicken soup or other clear broths, and maintaining good nutrition.  Getting plenty of rest.  Using gargles or lozenges for comfort.  Controlling fevers with ibuprofen or acetaminophen as directed by your caregiver.  Increasing usage of your inhaler if you have asthma. Zinc gel and zinc lozenges, taken in the first 24 hours of the common cold, can shorten the duration and lessen the severity of symptoms. Pain medicines may help with fever, muscle aches, and throat pain. A variety of non-prescription medicines are available to treat congestion and runny nose. Your caregiver can make recommendations and may suggest nasal or lung inhalers for other symptoms.  HOME CARE INSTRUCTIONS   Only take over-the-counter or prescription medicines for pain, discomfort, or fever as directed by your caregiver.  Use a warm mist humidifier or inhale steam from a shower to increase air moisture. This may keep secretions moist and make it easier to breathe.  Drink enough water and fluids to keep your urine clear or pale yellow.  Rest as needed.  Return to work when your temperature has returned to normal or as your caregiver advises. You may need to stay home longer to avoid infecting others. You can  also use a face mask and careful hand washing to prevent spread of the virus. SEEK MEDICAL CARE IF:   After the first few days, you feel you are getting worse rather than better.  You need your caregiver's advice about medicines to control symptoms.  You develop chills, worsening shortness of breath, or brown or red sputum. These may be signs of pneumonia.  You develop yellow or brown nasal discharge or pain in the face, especially when you bend forward. These may be signs of sinusitis.  You develop a fever, swollen neck glands, pain with swallowing, or white areas in the back of your throat. These may be signs  of strep throat. SEEK IMMEDIATE MEDICAL CARE IF:   You have a fever.  You develop severe or persistent headache, ear pain, sinus pain, or chest pain.  You develop wheezing, a prolonged cough, cough up blood, or have a change in your usual mucus (if you have chronic lung disease).  You develop sore muscles or a stiff neck. Document Released: 12/19/2000 Document Revised: 09/17/2011 Document Reviewed: 09/30/2013 [facility] Patient Information 2015 [place]. This information is not intended to replace advice given to you by your health care provider. Make sure you discuss any questions you have with your health care provider.    ________________________________________     To schedule your Maternity Eligibility Appointment, please call [phone].  When you arrive for your appointment you must bring the following items or information listed below.  Your appointment will be rescheduled if you do not have these items or are 15 minutes late. If currently receiving Medicaid, you MUST bring: 1. Medicaid Card 2. Social Security Card 3. Picture ID 4. Proof of Pregnancy 5. Verification of current address if the address on Medicaid card is incorrect "postmarked mail" If not receiving Medicaid, you MUST bring: 1. Social Security Card 2. Picture ID 3. Birth Certificate (if available) Passport or *Green Card 4. Proof of Pregnancy 5. Verification of current address "postmarked mail" for each income presented. 6. Verification of insurance coverage, if any 7. Check stubs from each employer for the previous month (if unable to present check stub  for each week, we will accept check stub for the first and last week ill the same month.) If you can't locate check stubs, you must bring a letter from the employer(s) and it must have the following information on letterhead, typed, in English: o name of company o company telephone number o how long been with the company, if less than one month o how  much person earns per hour o how many hours per week work o the gross pay the person earned for the previous month If you are 23 years old or less, you do not have to bring proof of income unless you work or live with the father of the baby and at that time we will need proof of income from you and/or the father of the baby. Green Card recipients are eligible for Medicaid for Pregnant Women (MPW)

## 2014-05-26 NOTE — MAU Provider Note (Signed)
History   [name] is a 23 yo female, G2P1001, presenting today with cold like symptoms with a PMH significant for Anemia. Since last Wednesday, she has experienced ongoing nasal congestion, sore throat, cough, and hoarseness. She has not had any fevers, and denies N/V/D, changes in urination, chest pain, and palpitations. She does complain of intermittent SOB but states that this is worse at night when lying down. She is currently not in respiratory distress or having trouble breathing.    CSN: [id number]  Arrival date and time: 05/26/14 1130   None     No chief complaint on file.  HPI    Past Medical History  Diagnosis Date  . Anemia     Past Surgical History  Procedure Laterality Date  . No past surgeries      Family History  Problem Relation Age of Onset  . Alcohol abuse Neg Hx     History  Substance Use Topics  . Smoking status: Never Smoker   . Smokeless tobacco: Never Used  . Alcohol Use: No    Allergies: No Known Allergies  Prescriptions prior to admission  Medication Sig Dispense Refill Last Dose  . metroNIDAZOLE (FLAGYL) 500 MG tablet Take 1 tablet (500 mg total) by mouth 2 (two) times daily. 14 tablet 0   . Prenatal Vit-Fe Fumarate-FA (PRENATAL MULTIVITAMIN) TABS tablet Take 1 tablet by mouth daily.    02/15/2014 at Unknown time    ROS Physical Exam   Blood pressure 110/69, pulse 100, temperature 98.1 F (36.7 C), temperature source Oral, resp. rate 18, height 5\' 2"  (1.575 m), weight 64.411 kg (142 lb), last menstrual period 07/12/2013, SpO2 100 %.  Physical Exam  Constitutional: She is oriented to person, place, and time. She appears well-developed and well-nourished. No distress.  HENT:  Head: Normocephalic and atraumatic.  Mouth/Throat: Posterior oropharyngeal erythema present. No oropharyngeal exudate.  Cardiovascular: Normal rate, regular rhythm and normal heart sounds.  Exam reveals no gallop and no friction rub.   No murmur  heard. Respiratory: Effort normal and breath sounds normal. No respiratory distress. She has no wheezes. She has no rales.  Lymphadenopathy:       Head (right side): Submandibular adenopathy present.       Head (left side): Submandibular adenopathy present.  Neurological: She is alert and oriented to person, place, and time.  Skin: Skin is warm and dry. No rash noted. She is not diaphoretic. No erythema.   Results for orders placed or performed during the hospital encounter of 05/26/14 (from the past 24 hour(s))  Rapid strep screen     Status: None   Collection Time: 05/26/14 12:00 PM  Result Value Ref Range   Streptococcus, Group A Screen (Direct) NEGATIVE NEGATIVE  Urinalysis, Routine w reflex microscopic     Status: Abnormal   Collection Time: 05/26/14 12:08 PM  Result Value Ref Range   Color, Urine AMBER (A) YELLOW   APPearance CLEAR CLEAR   Specific Gravity, Urine >1.030 (H) 1.005 - 1.030   pH 6.0 5.0 - 8.0   Glucose, UA NEGATIVE NEGATIVE mg/dL   Hgb urine dipstick NEGATIVE NEGATIVE   Bilirubin Urine SMALL (A) NEGATIVE   Ketones, ur 15 (A) NEGATIVE mg/dL   Protein, ur 30 (A) NEGATIVE mg/dL   Urobilinogen, UA 1.0 0.0 - 1.0 mg/dL   Nitrite NEGATIVE NEGATIVE   Leukocytes, UA MODERATE (A) NEGATIVE  Urine microscopic-add on     Status: Abnormal   Collection Time: 05/26/14 12:08 PM  Result Value  Ref Range   Squamous Epithelial / LPF MANY (A) RARE   WBC, UA 11-20 <3 WBC/hpf   Bacteria, UA FEW (A) RARE   Urine-Other MUCOUS PRESENT   Pregnancy, urine POC     Status: Abnormal   Collection Time: 05/26/14 12:15 PM  Result Value Ref Range   Preg Test, Ur POSITIVE (A) NEGATIVE     MAU Course  Procedures   MDM   Assessment and Plan  Course of illness is likely viral in nature as evidenced by rapid strep, and the presence of cough, congestion, and absence of fever.   Provide supportive measures including hydration, tylenol/iburprofen as needed, and humidified air.   Knox RoyaltyRamirez,  Nicholas D, Student-PA 05/26/2014, 1:57 PM   Evaluation and management procedures were performed by PA-S under my supervision/collaboration. Chart reviewed, patient examined by me and I agree with management and plan.  Addendum: No known pregnancy problems. Had transportation problems so [facility] NOB appt.  Problem list, past medical history, Ob/Gyn history, surgical history, family history and social history reviewed and updated as appropriate. Filed Vitals:   05/26/14 1200  BP: 110/69  Pulse: 100  Temp: 98.1 F (36.7 C)  TempSrc: Oral  Resp: 18  Height: 5\' 2"  (1.575 m)  Weight: 64.411 kg (142 lb)  SpO2: 100%  S+D SSE: physiologic D/C SVE: L/C/H Results for orders placed or performed during the hospital encounter of 05/26/14 (from the past 24 hour(s))  Rapid strep screen     Status: None   Collection Time: 05/26/14 12:00 PM  Result Value Ref Range   Streptococcus, Group A Screen (Direct) NEGATIVE NEGATIVE  Urinalysis, Routine w reflex microscopic     Status: Abnormal   Collection Time: 05/26/14 12:08 PM  Result Value Ref Range   Color, Urine AMBER (A) YELLOW   APPearance CLEAR CLEAR   Specific Gravity, Urine >1.030 (H) 1.005 - 1.030   pH 6.0 5.0 - 8.0   Glucose, UA NEGATIVE NEGATIVE mg/dL   Hgb urine dipstick NEGATIVE NEGATIVE   Bilirubin Urine SMALL (A) NEGATIVE   Ketones, ur 15 (A) NEGATIVE mg/dL   Protein, ur 30 (A) NEGATIVE mg/dL   Urobilinogen, UA 1.0 0.0 - 1.0 mg/dL   Nitrite NEGATIVE NEGATIVE   Leukocytes, UA MODERATE (A) NEGATIVE  Urine microscopic-add on     Status: Abnormal   Collection Time: 05/26/14 12:08 PM  Result Value Ref Range   Squamous Epithelial / LPF MANY (A) RARE   WBC, UA 11-20 <3 WBC/hpf   Bacteria, UA FEW (A) RARE   Urine-Other MUCOUS PRESENT   Pregnancy, urine POC     Status: Abnormal   Collection Time: 05/26/14 12:15 PM  Result Value Ref Range   Preg Test, Ur POSITIVE (A) NEGATIVE   NPC but had MAU visit and dated by US at 21 wks. Cannot  stay for 1 hr OGTT. OB panel, HIV, UDS, urine C&S, GBS, GC/CT sent. 1. URI (upper respiratory infection)   2. Insufficient prenatal care in third trimester   G2P1001 at [id number] Cat 1 FHR tracing   Medication List    STOP taking these medications        metroNIDAZOLE 500 MG tablet  Commonly known as:  FLAGYL      TAKE these medications        prenatal multivitamin Tabs tablet  Take 1 tablet by mouth daily.       Follow-up Information    Follow up with [facility] In 1 week.   Why:  Someone from Clinic will call you with appt.   Contact information:   [address] [place] [address]

## 2014-05-26 NOTE — MAU Note (Signed)
Been out of work past 2 days, no energy.  Strep throat is going around at work, thought she should get checked. has hd sore throat and hoarse voice since Sunday.nasal congestion/draingage.  Denies fever.

## 2014-05-27 LAB — HEPATITIS B SURFACE ANTIGEN: Hepatitis B Surface Ag: NEGATIVE

## 2014-05-27 LAB — RUBELLA SCREEN: RUBELLA: 1.29 {index} — AB (ref <0.90)

## 2014-05-27 LAB — GC/CHLAMYDIA PROBE AMP
CT Probe RNA: NEGATIVE
GC Probe RNA: NEGATIVE

## 2014-05-28 LAB — CULTURE, GROUP A STREP

## 2014-05-29 LAB — CULTURE, BETA STREP (GROUP B ONLY)

## 2014-05-30 ENCOUNTER — Encounter: Payer: Self-pay | Admitting: Physician Assistant

## 2014-05-30 DIAGNOSIS — B951 Streptococcus, group B, as the cause of diseases classified elsewhere: Secondary | ICD-10-CM

## 2014-05-30 HISTORY — DX: Streptococcus, group b, as the cause of diseases classified elsewhere: B95.1

## 2014-06-07 ENCOUNTER — Encounter: Payer: Self-pay | Admitting: Family Medicine

## 2014-06-07 ENCOUNTER — Ambulatory Visit (INDEPENDENT_AMBULATORY_CARE_PROVIDER_SITE_OTHER): Payer: Self-pay | Admitting: Family Medicine

## 2014-06-07 VITALS — BP 98/79 | HR 95 | Temp 98.4°F | Wt 143.0 lb

## 2014-06-07 DIAGNOSIS — Z23 Encounter for immunization: Secondary | ICD-10-CM

## 2014-06-07 DIAGNOSIS — O0933 Supervision of pregnancy with insufficient antenatal care, third trimester: Secondary | ICD-10-CM

## 2014-06-07 DIAGNOSIS — Z3483 Encounter for supervision of other normal pregnancy, third trimester: Secondary | ICD-10-CM

## 2014-06-07 DIAGNOSIS — O0932 Supervision of pregnancy with insufficient antenatal care, second trimester: Secondary | ICD-10-CM

## 2014-06-07 DIAGNOSIS — B951 Streptococcus, group B, as the cause of diseases classified elsewhere: Secondary | ICD-10-CM

## 2014-06-07 LAB — POCT URINALYSIS DIP (DEVICE)
BILIRUBIN URINE: NEGATIVE
Glucose, UA: NEGATIVE mg/dL
Ketones, ur: NEGATIVE mg/dL
Nitrite: NEGATIVE
Protein, ur: NEGATIVE mg/dL
SPECIFIC GRAVITY, URINE: 1.015 (ref 1.005–1.030)
Urobilinogen, UA: 1 mg/dL (ref 0.0–1.0)
pH: 7 (ref 5.0–8.0)

## 2014-06-07 MED ORDER — TETANUS-DIPHTH-ACELL PERTUSSIS 5-2.5-18.5 LF-MCG/0.5 IM SUSP
0.5000 mL | Freq: Once | INTRAMUSCULAR | Status: AC
  Administered 2014-06-07: 0.5 mL via INTRAMUSCULAR

## 2014-06-07 NOTE — Patient Instructions (Signed)
Third Trimester of Pregnancy The third trimester is from week 29 through week 42, months 7 through 9. The third trimester is a time when the fetus is growing rapidly. At the end of the ninth month, the fetus is about 20 inches in length and weighs 6-10 pounds.  BODY CHANGES Your body goes through many changes during pregnancy. The changes vary from woman to woman.   Your weight will continue to increase. You can expect to gain 25-35 pounds (11-16 kg) by the end of the pregnancy.  You may begin to get stretch marks on your hips, abdomen, and breasts.  You may urinate more often because the fetus is moving lower into your pelvis and pressing on your bladder.  You may develop or continue to have heartburn as a result of your pregnancy.  You may develop constipation because certain hormones are causing the muscles that push waste through your intestines to slow down.  You may develop hemorrhoids or swollen, bulging veins (varicose veins).  You may have pelvic pain because of the weight gain and pregnancy hormones relaxing your joints between the bones in your pelvis. Backaches may result from overexertion of the muscles supporting your posture.  You may have changes in your hair. These can include thickening of your hair, rapid growth, and changes in texture. Some women also have hair loss during or after pregnancy, or hair that feels dry or thin. Your hair will most likely return to normal after your baby is born.  Your breasts will continue to grow and be tender. A yellow discharge may leak from your breasts called colostrum.  Your belly button may stick out.  You may feel short of breath because of your expanding uterus.  You may notice the fetus "dropping," or moving lower in your abdomen.  You may have a bloody mucus discharge. This usually occurs a few days to a week before labor begins.  Your cervix becomes thin and soft (effaced) near your due date. WHAT TO EXPECT AT YOUR  PRENATAL EXAMS  You will have prenatal exams every 2 weeks until week 36. Then, you will have weekly prenatal exams. During a routine prenatal visit:  You will be weighed to make sure you and the fetus are growing normally.  Your blood pressure is taken.  Your abdomen will be measured to track your baby's growth.  The fetal heartbeat will be listened to.  Any test results from the previous visit will be discussed.  You may have a cervical check near your due date to see if you have effaced. At around 36 weeks, your caregiver will check your cervix. At the same time, your caregiver will also perform a test on the secretions of the vaginal tissue. This test is to determine if a type of bacteria, Group B streptococcus, is present. Your caregiver will explain this further. Your caregiver may ask you:  What your birth plan is.  How you are feeling.  If you are feeling the baby move.  If you have had any abnormal symptoms, such as leaking fluid, bleeding, severe headaches, or abdominal cramping.  If you have any questions. Other tests or screenings that may be performed during your third trimester include:  Blood tests that check for low iron levels (anemia).  Fetal testing to check the health, activity level, and growth of the fetus. Testing is done if you have certain medical conditions or if there are problems during the pregnancy. FALSE LABOR You may feel small, irregular contractions that   eventually go away. These are called Braxton Hicks contractions, or false labor. Contractions may last for hours, days, or even weeks before true labor sets in. If contractions come at regular intervals, intensify, or become painful, it is best to be seen by your caregiver.  SIGNS OF LABOR   Menstrual-like cramps.  Contractions that are 5 minutes apart or less.  Contractions that start on the top of the uterus and spread down to the lower abdomen and back.  A sense of increased pelvic  pressure or back pain.  A watery or bloody mucus discharge that comes from the vagina. If you have any of these signs before the 37th week of pregnancy, call your caregiver right away. You need to go to the hospital to get checked immediately. HOME CARE INSTRUCTIONS   Avoid all smoking, herbs, alcohol, and unprescribed drugs. These chemicals affect the formation and growth of the baby.  Follow your caregiver's instructions regarding medicine use. There are medicines that are either safe or unsafe to take during pregnancy.  Exercise only as directed by your caregiver. Experiencing uterine cramps is a good sign to stop exercising.  Continue to eat regular, healthy meals.  Wear a good support bra for breast tenderness.  Do not use hot tubs, steam rooms, or saunas.  Wear your seat belt at all times when driving.  Avoid raw meat, uncooked cheese, cat litter boxes, and soil used by cats. These carry germs that can cause birth defects in the baby.  Take your prenatal vitamins.  Try taking a stool softener (if your caregiver approves) if you develop constipation. Eat more high-fiber foods, such as fresh vegetables or fruit and whole grains. Drink plenty of fluids to keep your urine clear or pale yellow.  Take warm sitz baths to soothe any pain or discomfort caused by hemorrhoids. Use hemorrhoid cream if your caregiver approves.  If you develop varicose veins, wear support hose. Elevate your feet for 15 minutes, 3-4 times a day. Limit salt in your diet.  Avoid heavy lifting, wear low heal shoes, and practice good posture.  Rest a lot with your legs elevated if you have leg cramps or low back pain.  Visit your dentist if you have not gone during your pregnancy. Use a soft toothbrush to brush your teeth and be gentle when you floss.  A sexual relationship may be continued unless your caregiver directs you otherwise.  Do not travel far distances unless it is absolutely necessary and only  with the approval of your caregiver.  Take prenatal classes to understand, practice, and ask questions about the labor and delivery.  Make a trial run to the hospital.  Pack your hospital bag.  Prepare the baby's nursery.  Continue to go to all your prenatal visits as directed by your caregiver. SEEK MEDICAL CARE IF:  You are unsure if you are in labor or if your water has broken.  You have dizziness.  You have mild pelvic cramps, pelvic pressure, or nagging pain in your abdominal area.  You have persistent nausea, vomiting, or diarrhea.  You have a bad smelling vaginal discharge.  You have pain with urination. SEEK IMMEDIATE MEDICAL CARE IF:   You have a fever.  You are leaking fluid from your vagina.  You have spotting or bleeding from your vagina.  You have severe abdominal cramping or pain.  You have rapid weight loss or gain.  You have shortness of breath with chest pain.  You notice sudden or extreme swelling   of your face, hands, ankles, feet, or legs.  You have not felt your baby move in over an hour.  You have severe headaches that do not go away with medicine.  You have vision changes. Document Released: 06/19/2001 Document Revised: 06/30/2013 Document Reviewed: 08/26/2012 [employer] Patient Information 2015 [employer]. This information is not intended to replace advice given to you by your health care provider. Make sure you discuss any questions you have with your health care provider.  Breastfeeding Deciding to breastfeed is one of the best choices you can make for you and your baby. A change in hormones during pregnancy causes your breast tissue to grow and increases the number and size of your milk ducts. These hormones also allow proteins, sugars, and fats from your blood supply to make breast milk in your milk-producing glands. Hormones prevent breast milk from being released before your baby is born as well as prompt milk flow after birth. Once  breastfeeding has begun, thoughts of your baby, as well as his or her sucking or crying, can stimulate the release of milk from your milk-producing glands.  BENEFITS OF BREASTFEEDING For Your Baby  Your first milk (colostrum) helps your baby's digestive system function better.   There are antibodies in your milk that help your baby fight off infections.   Your baby has a lower incidence of asthma, allergies, and sudden infant death syndrome.   The nutrients in breast milk are better for your baby than infant formulas and are designed uniquely for your baby's needs.   Breast milk improves your baby's brain development.   Your baby is less likely to develop other conditions, such as childhood obesity, asthma, or type 2 diabetes mellitus.  For You   Breastfeeding helps to create a very special bond between you and your baby.   Breastfeeding is convenient. Breast milk is always available at the correct temperature and costs nothing.   Breastfeeding helps to burn calories and helps you lose the weight gained during pregnancy.   Breastfeeding makes your uterus contract to its prepregnancy size faster and slows bleeding (lochia) after you give birth.   Breastfeeding helps to lower your risk of developing type 2 diabetes mellitus, osteoporosis, and breast or ovarian cancer later in life. SIGNS THAT YOUR BABY IS HUNGRY Early Signs of Hunger  Increased alertness or activity.  Stretching.  Movement of the head from side to side.  Movement of the head and opening of the mouth when the corner of the mouth or cheek is stroked (rooting).  Increased sucking sounds, smacking lips, cooing, sighing, or squeaking.  Hand-to-mouth movements.  Increased sucking of fingers or hands. Late Signs of Hunger  Fussing.  Intermittent crying. Extreme Signs of Hunger Signs of extreme hunger will require calming and consoling before your baby will be able to breastfeed successfully. Do not  wait for the following signs of extreme hunger to occur before you initiate breastfeeding:   Restlessness.  A loud, strong cry.   Screaming. BREASTFEEDING BASICS Breastfeeding Initiation  Find a comfortable place to sit or lie down, with your neck and back well supported.  Place a pillow or rolled up blanket under your baby to bring him or her to the level of your breast (if you are seated). Nursing pillows are specially designed to help support your arms and your baby while you breastfeed.  Make sure that your baby's abdomen is facing your abdomen.   Gently massage your breast. With your fingertips, massage from your chest   wall toward your nipple in a circular motion. This encourages milk flow. You may need to continue this action during the feeding if your milk flows slowly.  Support your breast with 4 fingers underneath and your thumb above your nipple. Make sure your fingers are well away from your nipple and your baby's mouth.   Stroke your baby's lips gently with your finger or nipple.   When your baby's mouth is open wide enough, quickly bring your baby to your breast, placing your entire nipple and as much of the colored area around your nipple (areola) as possible into your baby's mouth.   More areola should be visible above your baby's upper lip than below the lower lip.   Your baby's tongue should be between his or her lower gum and your breast.   Ensure that your baby's mouth is correctly positioned around your nipple (latched). Your baby's lips should create a seal on your breast and be turned out (everted).  It is common for your baby to suck about 2-3 minutes in order to start the flow of breast milk. Latching Teaching your baby how to latch on to your breast properly is very important. An improper latch can cause nipple pain and decreased milk supply for you and poor weight gain in your baby. Also, if your baby is not latched onto your nipple properly, he or she  may swallow some air during feeding. This can make your baby fussy. Burping your baby when you switch breasts during the feeding can help to get rid of the air. However, teaching your baby to latch on properly is still the best way to prevent fussiness from swallowing air while breastfeeding. Signs that your baby has successfully latched on to your nipple:    Silent tugging or silent sucking, without causing you pain.   Swallowing heard between every 3-4 sucks.    Muscle movement above and in front of his or her ears while sucking.  Signs that your baby has not successfully latched on to nipple:   Sucking sounds or smacking sounds from your baby while breastfeeding.  Nipple pain. If you think your baby has not latched on correctly, slip your finger into the corner of your baby's mouth to break the suction and place it between your baby's gums. Attempt breastfeeding initiation again. Signs of Successful Breastfeeding Signs from your baby:   A gradual decrease in the number of sucks or complete cessation of sucking.   Falling asleep.   Relaxation of his or her body.   Retention of a small amount of milk in his or her mouth.   Letting go of your breast by himself or herself. Signs from you:  Breasts that have increased in firmness, weight, and size 1-3 hours after feeding.   Breasts that are softer immediately after breastfeeding.  Increased milk volume, as well as a change in milk consistency and color by the fifth day of breastfeeding.   Nipples that are not sore, cracked, or bleeding. Signs That Your Baby is Getting Enough Milk  Wetting at least 3 diapers in a 24-hour period. The urine should be clear and pale yellow by age 5 days.  At least 3 stools in a 24-hour period by age 5 days. The stool should be soft and yellow.  At least 3 stools in a 24-hour period by age 7 days. The stool should be seedy and yellow.  No loss of weight greater than 10% of birth weight  during the first 3   days of age.  Average weight gain of 4-7 ounces (113-198 g) per week after age 4 days.  Consistent daily weight gain by age 5 days, without weight loss after the age of 2 weeks. After a feeding, your baby may spit up a small amount. This is common. BREASTFEEDING FREQUENCY AND DURATION Frequent feeding will help you make more milk and can prevent sore nipples and breast engorgement. Breastfeed when you feel the need to reduce the fullness of your breasts or when your baby shows signs of hunger. This is called "breastfeeding on demand." Avoid introducing a pacifier to your baby while you are working to establish breastfeeding (the first 4-6 weeks after your baby is born). After this time you may choose to use a pacifier. Research has shown that pacifier use during the first year of a baby's life decreases the risk of sudden infant death syndrome (SIDS). Allow your baby to feed on each breast as long as he or she wants. Breastfeed until your baby is finished feeding. When your baby unlatches or falls asleep while feeding from the first breast, offer the second breast. Because newborns are often sleepy in the first few weeks of life, you may need to awaken your baby to get him or her to feed. Breastfeeding times will vary from baby to baby. However, the following rules can serve as a guide to help you ensure that your baby is properly fed:  Newborns (babies 4 weeks of age or younger) may breastfeed every 1-3 hours.  Newborns should not go longer than 3 hours during the day or 5 hours during the night without breastfeeding.  You should breastfeed your baby a minimum of 8 times in a 24-hour period until you begin to introduce solid foods to your baby at around 6 months of age. BREAST MILK PUMPING Pumping and storing breast milk allows you to ensure that your baby is exclusively fed your breast milk, even at times when you are unable to breastfeed. This is especially important if you are  going back to work while you are still breastfeeding or when you are not able to be present during feedings. Your lactation consultant can give you guidelines on how long it is safe to store breast milk.  A breast pump is a machine that allows you to pump milk from your breast into a sterile bottle. The pumped breast milk can then be stored in a refrigerator or freezer. Some breast pumps are operated by hand, while others use electricity. Ask your lactation consultant which type will work best for you. Breast pumps can be purchased, but some hospitals and breastfeeding support groups lease breast pumps on a monthly basis. A lactation consultant can teach you how to hand express breast milk, if you prefer not to use a pump.  CARING FOR YOUR BREASTS WHILE YOU BREASTFEED Nipples can become dry, cracked, and sore while breastfeeding. The following recommendations can help keep your breasts moisturized and healthy:  Avoid using soap on your nipples.   Wear a supportive bra. Although not required, special nursing bras and tank tops are designed to allow access to your breasts for breastfeeding without taking off your entire bra or top. Avoid wearing underwire-style bras or extremely tight bras.  Air dry your nipples for 3-4minutes after each feeding.   Use only cotton bra pads to absorb leaked breast milk. Leaking of breast milk between feedings is normal.   Use lanolin on your nipples after breastfeeding. Lanolin helps to maintain your skin's   normal moisture barrier. If you use pure lanolin, you do not need to wash it off before feeding your baby again. Pure lanolin is not toxic to your baby. You may also hand express a few drops of breast milk and gently massage that milk into your nipples and allow the milk to air dry. In the first few weeks after giving birth, some women experience extremely full breasts (engorgement). Engorgement can make your breasts feel heavy, warm, and tender to the touch.  Engorgement peaks within 3-5 days after you give birth. The following recommendations can help ease engorgement:  Completely empty your breasts while breastfeeding or pumping. You may want to start by applying warm, moist heat (in the shower or with warm water-soaked hand towels) just before feeding or pumping. This increases circulation and helps the milk flow. If your baby does not completely empty your breasts while breastfeeding, pump any extra milk after he or she is finished.  Wear a snug bra (nursing or regular) or tank top for 1-2 days to signal your body to slightly decrease milk production.  Apply ice packs to your breasts, unless this is too uncomfortable for you.  Make sure that your baby is latched on and positioned properly while breastfeeding. If engorgement persists after 48 hours of following these recommendations, contact your health care provider or a lactation consultant. OVERALL HEALTH CARE RECOMMENDATIONS WHILE BREASTFEEDING  Eat healthy foods. Alternate between meals and snacks, eating 3 of each per day. Because what you eat affects your breast milk, some of the foods may make your baby more irritable than usual. Avoid eating these foods if you are sure that they are negatively affecting your baby.  Drink milk, fruit juice, and water to satisfy your thirst (about 10 glasses a day).   Rest often, relax, and continue to take your prenatal vitamins to prevent fatigue, stress, and anemia.  Continue breast self-awareness checks.  Avoid chewing and smoking tobacco.  Avoid alcohol and drug use. Some medicines that may be harmful to your baby can pass through breast milk. It is important to ask your health care provider before taking any medicine, including all over-the-counter and prescription medicine as well as vitamin and herbal supplements. It is possible to become pregnant while breastfeeding. If birth control is desired, ask your health care provider about options that  will be safe for your baby. SEEK MEDICAL CARE IF:   You feel like you want to stop breastfeeding or have become frustrated with breastfeeding.  You have painful breasts or nipples.  Your nipples are cracked or bleeding.  Your breasts are red, tender, or warm.  You have a swollen area on either breast.  You have a fever or chills.  You have nausea or vomiting.  You have drainage other than breast milk from your nipples.  Your breasts do not become full before feedings by the fifth day after you give birth.  You feel sad and depressed.  Your baby is too sleepy to eat well.  Your baby is having trouble sleeping.   Your baby is wetting less than 3 diapers in a 24-hour period.  Your baby has less than 3 stools in a 24-hour period.  Your baby's skin or the white part of his or her eyes becomes yellow.   Your baby is not gaining weight by 5 days of age. SEEK IMMEDIATE MEDICAL CARE IF:   Your baby is overly tired (lethargic) and does not want to wake up and feed.  Your baby   develops an unexplained fever. Document Released: 06/25/2005 Document Revised: 06/30/2013 Document Reviewed: 12/17/2012 [employer] Patient Information 2015 [employer]. This information is not intended to replace advice given to you by your health care provider. Make sure you discuss any questions you have with your health care provider.  

## 2014-06-07 NOTE — Progress Notes (Signed)
   Subjective:    Julie Strong is a G2P1001 4158w3d being seen today for her first obstetrical visit.  Her obstetrical history is significant for late prenatal care. Patient does intend to breast feed. Pregnancy history fully reviewed.  Patient reports no complaints.  Filed Vitals:   06/07/14 1035  BP: 98/79  Pulse: 95  Temp: 98.4 F (36.9 C)  Weight: 143 lb (64.864 kg)    HISTORY: OB History  Gravida Para Term Preterm AB SAB TAB Ectopic Multiple Living  2 1 1       1     # Outcome Date GA Lbr Len/2nd Weight Sex Delivery Anes PTL Lv  2 Current           1 Term 02/16/12 [id number]  6 lb 13 oz (3.09 kg) M Vag-Spont EPI  Y     Past Medical History  Diagnosis Date  . Anemia    Past Surgical History  Procedure Laterality Date  . No past surgeries     Family History  Problem Relation Age of Onset  . Alcohol abuse Neg Hx   . Diabetes Paternal Grandmother      Exam     Skin: normal coloration and turgor, no rashes    Neurologic: oriented   Extremities: normal strength, tone, and muscle mass   HEENT sclera clear, anicteric   Mouth/Teeth mucous membranes moist, pharynx normal without lesions and dental hygiene good   Neck supple   Cardiovascular: regular rate and rhythm, no murmurs or gallops   Respiratory:  appears well, vitals normal, no respiratory distress, acyanotic, normal RR, ear and throat exam is normal, neck free of mass or lymphadenopathy, chest clear, no wheezing, crepitations, rhonchi, normal symmetric air entry   Abdomen: soft, non-tender; bowel sounds normal; no masses,  no organomegaly      Assessment:    Pregnancy: G2P1001 Patient Active Problem List   Diagnosis Date Noted  . Late prenatal care affecting pregnancy in second trimester, antepartum 02/24/2014    Priority: High  . Group beta Strep positive 05/30/2014        Plan:     Initial labs drawn. Prenatal vitamins. Problem list reviewed and updated. Genetic Screening discussed First  Screen: too late.  Ultrasound discussed; fetal survey: results reviewed.  Follow up in 1 weeks. Problem List Items Addressed This Visit      High   Late prenatal care affecting pregnancy in second trimester, antepartum     Unprioritized   Group beta Strep positive    Other Visit Diagnoses    Late prenatal care, third trimester    -  Primary    Relevant Orders       Glucose Tolerance, 1 HR (50g) w/o Fasting       Hemoglobinopathy evaluation    Need for Tdap vaccination        Relevant Medications       Tdap (BOOSTRIX) injection 0.5 mL (Completed)    Flu vaccine need        Relevant Orders       Flu Vaccine QUAD 36+ mos IM (Completed)      Pap may be done pp   Julie Strong 06/07/2014

## 2014-06-07 NOTE — Progress Notes (Signed)
Initial prenatal visit. Flu vaccine/Tdap vaccine

## 2014-06-08 ENCOUNTER — Telehealth: Payer: Self-pay

## 2014-06-08 LAB — GLUCOSE TOLERANCE, 1 HOUR (50G) W/O FASTING: Glucose, 1 Hour GTT: 144 mg/dL — ABNORMAL HIGH (ref 70–140)

## 2014-06-08 NOTE — Telephone Encounter (Signed)
Called patient and informed her of results and recommendations. Patient states she can come in Thursday 06/10/14 at 0800. Informed patient she must be fasting. Patient verbalized understanding and gratitude. No questions or concerns.

## 2014-06-08 NOTE — Telephone Encounter (Signed)
-----   Message from Reva Boresanya S Pratt, MD sent at 06/08/2014  9:35 AM EST ----- Abnl 1 hour-needs 3 hour

## 2014-06-09 LAB — HEMOGLOBINOPATHY EVALUATION
HGB A2 QUANT: 2.1 % — AB (ref 2.2–3.2)
HGB F QUANT: 0 % (ref 0.0–2.0)
HGB S QUANTITAION: 0 %
Hemoglobin Other: 0 %
Hgb A: 97.9 % — ABNORMAL HIGH (ref 96.8–97.8)

## 2014-06-10 ENCOUNTER — Other Ambulatory Visit: Payer: Self-pay

## 2014-06-10 ENCOUNTER — Encounter: Payer: Self-pay | Admitting: Obstetrics and Gynecology

## 2014-06-15 ENCOUNTER — Ambulatory Visit (INDEPENDENT_AMBULATORY_CARE_PROVIDER_SITE_OTHER): Payer: Self-pay | Admitting: Obstetrics and Gynecology

## 2014-06-15 VITALS — BP 111/61 | HR 84 | Temp 98.4°F | Wt 144.8 lb

## 2014-06-15 DIAGNOSIS — O0933 Supervision of pregnancy with insufficient antenatal care, third trimester: Secondary | ICD-10-CM

## 2014-06-15 DIAGNOSIS — Z3483 Encounter for supervision of other normal pregnancy, third trimester: Secondary | ICD-10-CM

## 2014-06-15 LAB — POCT URINALYSIS DIP (DEVICE)
Bilirubin Urine: NEGATIVE
Glucose, UA: NEGATIVE mg/dL
Hgb urine dipstick: NEGATIVE
Ketones, ur: NEGATIVE mg/dL
NITRITE: NEGATIVE
PROTEIN: NEGATIVE mg/dL
Specific Gravity, Urine: >=1.03 (ref 1.005–1.030)
UROBILINOGEN UA: 1 mg/dL (ref 0.0–1.0)
pH: 5.5 (ref 5.0–8.0)

## 2014-06-15 NOTE — Progress Notes (Signed)
Doing well.  Offers no complaints.  Denies any vaginal bleeding, some cramping occasionally that resolves.  No LOF.  Positive fetal movement.  Re- Viewed labor precautions.  Return on Thursday for 3-hour glucose testing and 1 week for return OB.

## 2014-06-15 NOTE — Progress Notes (Signed)
Patient missed appointment for 3hr gtt--- states she can come this Thursday 06/17/14 morning. Advised she schedule appointment on her way out today.

## 2014-06-15 NOTE — Patient Instructions (Signed)
Third Trimester of Pregnancy The third trimester is from week 29 through week 42, months 7 through 9. The third trimester is a time when the fetus is growing rapidly. At the end of the ninth month, the fetus is about 20 inches in length and weighs 6-10 pounds.  BODY CHANGES Your body goes through many changes during pregnancy. The changes vary from woman to woman.   Your weight will continue to increase. You can expect to gain 25-35 pounds (11-16 kg) by the end of the pregnancy.  You may begin to get stretch marks on your hips, abdomen, and breasts.  You may urinate more often because the fetus is moving lower into your pelvis and pressing on your bladder.  You may develop or continue to have heartburn as a result of your pregnancy.  You may develop constipation because certain hormones are causing the muscles that push waste through your intestines to slow down.  You may develop hemorrhoids or swollen, bulging veins (varicose veins).  You may have pelvic pain because of the weight gain and pregnancy hormones relaxing your joints between the bones in your pelvis. Backaches may result from overexertion of the muscles supporting your posture.  You may have changes in your hair. These can include thickening of your hair, rapid growth, and changes in texture. Some women also have hair loss during or after pregnancy, or hair that feels dry or thin. Your hair will most likely return to normal after your baby is born.  Your breasts will continue to grow and be tender. A yellow discharge may leak from your breasts called colostrum.  Your belly button may stick out.  You may feel short of breath because of your expanding uterus.  You may notice the fetus "dropping," or moving lower in your abdomen.  You may have a bloody mucus discharge. This usually occurs a few days to a week before labor begins.  Your cervix becomes thin and soft (effaced) near your due date. WHAT TO EXPECT AT YOUR PRENATAL  EXAMS  You will have prenatal exams every 2 weeks until week 36. Then, you will have weekly prenatal exams. During a routine prenatal visit:  You will be weighed to make sure you and the fetus are growing normally.  Your blood pressure is taken.  Your abdomen will be measured to track your baby's growth.  The fetal heartbeat will be listened to.  Any test results from the previous visit will be discussed.  You may have a cervical check near your due date to see if you have effaced. At around 36 weeks, your caregiver will check your cervix. At the same time, your caregiver will also perform a test on the secretions of the vaginal tissue. This test is to determine if a type of bacteria, Group B streptococcus, is present. Your caregiver will explain this further. Your caregiver may ask you:  What your birth plan is.  How you are feeling.  If you are feeling the baby move.  If you have had any abnormal symptoms, such as leaking fluid, bleeding, severe headaches, or abdominal cramping.  If you have any questions. Other tests or screenings that may be performed during your third trimester include:  Blood tests that check for low iron levels (anemia).  Fetal testing to check the health, activity level, and growth of the fetus. Testing is done if you have certain medical conditions or if there are problems during the pregnancy. FALSE LABOR You may feel small, irregular contractions that   eventually go away. These are called Braxton Hicks contractions, or false labor. Contractions may last for hours, days, or even weeks before true labor sets in. If contractions come at regular intervals, intensify, or become painful, it is best to be seen by your caregiver.  SIGNS OF LABOR   Menstrual-like cramps.  Contractions that are 5 minutes apart or less.  Contractions that start on the top of the uterus and spread down to the lower abdomen and back.  A sense of increased pelvic pressure or back  pain.  A watery or bloody mucus discharge that comes from the vagina. If you have any of these signs before the 37th week of pregnancy, call your caregiver right away. You need to go to the hospital to get checked immediately. HOME CARE INSTRUCTIONS   Avoid all smoking, herbs, alcohol, and unprescribed drugs. These chemicals affect the formation and growth of the baby.  Follow your caregiver's instructions regarding medicine use. There are medicines that are either safe or unsafe to take during pregnancy.  Exercise only as directed by your caregiver. Experiencing uterine cramps is a good sign to stop exercising.  Continue to eat regular, healthy meals.  Wear a good support bra for breast tenderness.  Do not use hot tubs, steam rooms, or saunas.  Wear your seat belt at all times when driving.  Avoid raw meat, uncooked cheese, cat litter boxes, and soil used by cats. These carry germs that can cause birth defects in the baby.  Take your prenatal vitamins.  Try taking a stool softener (if your caregiver approves) if you develop constipation. Eat more high-fiber foods, such as fresh vegetables or fruit and whole grains. Drink plenty of fluids to keep your urine clear or pale yellow.  Take warm sitz baths to soothe any pain or discomfort caused by hemorrhoids. Use hemorrhoid cream if your caregiver approves.  If you develop varicose veins, wear support hose. Elevate your feet for 15 minutes, 3-4 times a day. Limit salt in your diet.  Avoid heavy lifting, wear low heal shoes, and practice good posture.  Rest a lot with your legs elevated if you have leg cramps or low back pain.  Visit your dentist if you have not gone during your pregnancy. Use a soft toothbrush to brush your teeth and be gentle when you floss.  A sexual relationship may be continued unless your caregiver directs you otherwise.  Do not travel far distances unless it is absolutely necessary and only with the approval  of your caregiver.  Take prenatal classes to understand, practice, and ask questions about the labor and delivery.  Make a trial run to the hospital.  Pack your hospital bag.  Prepare the baby's nursery.  Continue to go to all your prenatal visits as directed by your caregiver. SEEK MEDICAL CARE IF:  You are unsure if you are in labor or if your water has broken.  You have dizziness.  You have mild pelvic cramps, pelvic pressure, or nagging pain in your abdominal area.  You have persistent nausea, vomiting, or diarrhea.  You have a bad smelling vaginal discharge.  You have pain with urination. SEEK IMMEDIATE MEDICAL CARE IF:   You have a fever.  You are leaking fluid from your vagina.  You have spotting or bleeding from your vagina.  You have severe abdominal cramping or pain.  You have rapid weight loss or gain.  You have shortness of breath with chest pain.  You notice sudden or extreme swelling   of your face, hands, ankles, feet, or legs.  You have not felt your baby move in over an hour.  You have severe headaches that do not go away with medicine.  You have vision changes. Document Released: 06/19/2001 Document Revised: 06/30/2013 Document Reviewed: 08/26/2012 ExitCare Patient Information 2015 [employer]. This information is not intended to replace advice given to you by your health care provider. Make sure you discuss any questions you have with your health care provider.  

## 2014-06-15 NOTE — Progress Notes (Signed)
Evaluation and management procedures were performed by SNM under my supervision/collaboration. Chart reviewed, patient examined by me and I agree with management and plan. GBS carriage explained.

## 2014-06-17 ENCOUNTER — Other Ambulatory Visit: Payer: Self-pay

## 2014-06-23 ENCOUNTER — Ambulatory Visit (INDEPENDENT_AMBULATORY_CARE_PROVIDER_SITE_OTHER): Payer: Self-pay | Admitting: Physician Assistant

## 2014-06-23 VITALS — BP 114/69 | HR 79 | Wt 142.9 lb

## 2014-06-23 DIAGNOSIS — Z3493 Encounter for supervision of normal pregnancy, unspecified, third trimester: Secondary | ICD-10-CM

## 2014-06-23 DIAGNOSIS — O0933 Supervision of pregnancy with insufficient antenatal care, third trimester: Secondary | ICD-10-CM

## 2014-06-23 DIAGNOSIS — Z3483 Encounter for supervision of other normal pregnancy, third trimester: Secondary | ICD-10-CM

## 2014-06-23 LAB — POCT URINALYSIS DIP (DEVICE)
Bilirubin Urine: NEGATIVE
Glucose, UA: NEGATIVE mg/dL
HGB URINE DIPSTICK: NEGATIVE
Ketones, ur: NEGATIVE mg/dL
NITRITE: NEGATIVE
Protein, ur: NEGATIVE mg/dL
Specific Gravity, Urine: 1.025 (ref 1.005–1.030)
UROBILINOGEN UA: 1 mg/dL (ref 0.0–1.0)
pH: 6 (ref 5.0–8.0)

## 2014-06-23 NOTE — Patient Instructions (Signed)

## 2014-06-23 NOTE — Progress Notes (Signed)
23yo female presenting at 38weeks 5days. Denies leakage of fluid, vaginal discharge, and vaginal bleeding. States she has contractions every few days, but nothing scheduled. Does not have pediatrician established, but will start looking for one. Will pick up list of pediatricians when she leaves today. Works at [employer] and states it is difficult to get off work for three hours so she can have the 3hr glucose test done. States she should be able to come in on Friday 12/18; will schedule appointment for 3hr. No further complaints today.

## 2014-06-23 NOTE — Progress Notes (Signed)
Pt seen with [place] DO.   Pt advised to have 3 hour GTT asap.  She agrees to come back in 2 days, fasting, for this test.   GBS positive back in November. RTC 12/18 for 3 hour GTT OBF in 1 week

## 2014-06-23 NOTE — Progress Notes (Signed)
Pt has not had 3 hr test, has trouble getting off of work.

## 2014-06-25 ENCOUNTER — Other Ambulatory Visit: Payer: Self-pay

## 2014-06-25 DIAGNOSIS — R7309 Other abnormal glucose: Secondary | ICD-10-CM

## 2014-06-25 LAB — URINE CULTURE
COLONY COUNT: NO GROWTH
ORGANISM ID, BACTERIA: NO GROWTH

## 2014-06-26 LAB — GLUCOSE TOLERANCE, 3 HOURS
GLUCOSE 3 HOUR GTT: 108 mg/dL (ref 70–144)
GLUCOSE, 1 HOUR-GESTATIONAL: 100 mg/dL (ref 70–189)
GLUCOSE, 2 HOUR-GESTATIONAL: 120 mg/dL (ref 70–164)
Glucose Tolerance, Fasting: 71 mg/dL (ref 70–104)

## 2014-06-29 ENCOUNTER — Inpatient Hospital Stay (HOSPITAL_COMMUNITY)
Admission: AD | Admit: 2014-06-29 | Discharge: 2014-07-01 | DRG: 775 | Disposition: A | Discharge status: Home / Self Care | Payer: Medicaid Other | Source: Ambulatory Visit | Attending: Family Medicine | Admitting: Family Medicine

## 2014-06-29 ENCOUNTER — Encounter (HOSPITAL_COMMUNITY): Payer: Self-pay | Admitting: *Deleted

## 2014-06-29 ENCOUNTER — Inpatient Hospital Stay (HOSPITAL_COMMUNITY): Payer: Medicaid Other | Admitting: Anesthesiology

## 2014-06-29 DIAGNOSIS — Z3A39 39 weeks gestation of pregnancy: Secondary | ICD-10-CM | POA: Diagnosis present

## 2014-06-29 DIAGNOSIS — IMO0001 Reserved for inherently not codable concepts without codable children: Secondary | ICD-10-CM

## 2014-06-29 DIAGNOSIS — O99824 Streptococcus B carrier state complicating childbirth: Secondary | ICD-10-CM | POA: Diagnosis present

## 2014-06-29 DIAGNOSIS — B951 Streptococcus, group B, as the cause of diseases classified elsewhere: Secondary | ICD-10-CM

## 2014-06-29 LAB — TYPE AND SCREEN
ABO/RH(D): A POS
Antibody Screen: NEGATIVE

## 2014-06-29 LAB — CBC
HEMATOCRIT: 30.5 % — AB (ref 36.0–46.0)
Hemoglobin: 9.2 g/dL — ABNORMAL LOW (ref 12.0–15.0)
MCH: 21.1 pg — ABNORMAL LOW (ref 26.0–34.0)
MCHC: 30.2 g/dL (ref 30.0–36.0)
MCV: 70 fL — ABNORMAL LOW (ref 78.0–100.0)
Platelets: 256 10*3/uL (ref 150–400)
RBC: 4.36 MIL/uL (ref 3.87–5.11)
RDW: 17.8 % — AB (ref 11.5–15.5)
WBC: 13 10*3/uL — ABNORMAL HIGH (ref 4.0–10.5)

## 2014-06-29 LAB — RPR

## 2014-06-29 LAB — HIV ANTIBODY (ROUTINE TESTING W REFLEX): HIV: NONREACTIVE

## 2014-06-29 MED ORDER — DIPHENHYDRAMINE HCL 50 MG/ML IJ SOLN: 12.5000 mg | INTRAMUSCULAR | Status: DC | PRN

## 2014-06-29 MED ORDER — LACTATED RINGERS IV SOLN: 500.0000 mL | Freq: Once | INTRAVENOUS | Status: DC

## 2014-06-29 MED ORDER — PHENYLEPHRINE 40 MCG/ML (10ML) SYRINGE FOR IV PUSH (FOR BLOOD PRESSURE SUPPORT): 80.0000 ug | PREFILLED_SYRINGE | INTRAVENOUS | Status: DC | PRN

## 2014-06-29 MED ORDER — PHENYLEPHRINE 40 MCG/ML (10ML) SYRINGE FOR IV PUSH (FOR BLOOD PRESSURE SUPPORT)
80.0000 ug | PREFILLED_SYRINGE | INTRAVENOUS | Status: DC | PRN
  Filled 2014-06-29: qty 2

## 2014-06-29 MED ORDER — EPHEDRINE 5 MG/ML INJ
10.0000 mg | INTRAVENOUS | Status: DC | PRN
  Filled 2014-06-29: qty 2

## 2014-06-29 MED ORDER — LACTATED RINGERS IV SOLN
500.0000 mL | INTRAVENOUS | Status: DC | PRN
  Administered 2014-06-29: 1000 mL via INTRAVENOUS

## 2014-06-29 MED ORDER — LIDOCAINE HCL (PF) 1 % IJ SOLN
30.0000 mL | INTRAMUSCULAR | Status: AC | PRN
  Administered 2014-06-29 (×2): 4 mL via SUBCUTANEOUS

## 2014-06-29 MED ORDER — PHENYLEPHRINE 40 MCG/ML (10ML) SYRINGE FOR IV PUSH (FOR BLOOD PRESSURE SUPPORT)
PREFILLED_SYRINGE | INTRAVENOUS | Status: AC
  Filled 2014-06-29: qty 10

## 2014-06-29 MED ORDER — OXYCODONE-ACETAMINOPHEN 5-325 MG PO TABS: 1.0000 | ORAL_TABLET | ORAL | Status: DC | PRN

## 2014-06-29 MED ORDER — LACTATED RINGERS IV SOLN
INTRAVENOUS | Status: DC
  Administered 2014-06-29: 19:00:00 via INTRAVENOUS

## 2014-06-29 MED ORDER — CITRIC ACID-SODIUM CITRATE 334-500 MG/5ML PO SOLN: 30.0000 mL | ORAL | Status: DC | PRN

## 2014-06-29 MED ORDER — PENICILLIN G POTASSIUM 5000000 UNITS IJ SOLR
2.5000 10*6.[IU] | INTRAVENOUS | Status: DC
  Administered 2014-06-29 – 2014-06-30 (×2): 2.5 10*6.[IU] via INTRAVENOUS
  Filled 2014-06-29 (×6): qty 2.5

## 2014-06-29 MED ORDER — FENTANYL 2.5 MCG/ML BUPIVACAINE 1/10 % EPIDURAL INFUSION (WH - ANES)
14.0000 mL/h | INTRAMUSCULAR | Status: DC | PRN
  Administered 2014-06-30: 14 mL/h via EPIDURAL
  Filled 2014-06-29: qty 125

## 2014-06-29 MED ORDER — EPHEDRINE 5 MG/ML INJ: 10.0000 mg | INTRAVENOUS | Status: DC | PRN

## 2014-06-29 MED ORDER — OXYTOCIN 40 UNITS IN LACTATED RINGERS INFUSION - SIMPLE MED
62.5000 mL/h | INTRAVENOUS | Status: DC
  Filled 2014-06-29: qty 1000

## 2014-06-29 MED ORDER — ACETAMINOPHEN 325 MG PO TABS: 650.0000 mg | ORAL_TABLET | ORAL | Status: DC | PRN

## 2014-06-29 MED ORDER — ONDANSETRON HCL 4 MG/2ML IJ SOLN: 4.0000 mg | Freq: Four times a day (QID) | INTRAMUSCULAR | Status: DC | PRN

## 2014-06-29 MED ORDER — OXYTOCIN BOLUS FROM INFUSION: 500.0000 mL | INTRAVENOUS | Status: DC

## 2014-06-29 MED ORDER — OXYCODONE-ACETAMINOPHEN 5-325 MG PO TABS: 2.0000 | ORAL_TABLET | ORAL | Status: DC | PRN

## 2014-06-29 MED ORDER — FENTANYL 2.5 MCG/ML BUPIVACAINE 1/10 % EPIDURAL INFUSION (WH - ANES)
INTRAMUSCULAR | Status: AC
  Administered 2014-06-29: 14 mL/h via EPIDURAL
  Filled 2014-06-29: qty 125

## 2014-06-29 MED ORDER — PENICILLIN G POTASSIUM 5000000 UNITS IJ SOLR
5.0000 10*6.[IU] | Freq: Once | INTRAVENOUS | Status: AC
  Administered 2014-06-29: 5 10*6.[IU] via INTRAVENOUS
  Filled 2014-06-29: qty 5

## 2014-06-29 NOTE — MAU Note (Signed)
Report called by Maury Dusonna RN charge in [facility]

## 2014-06-29 NOTE — MAU Note (Signed)
Patient states she is having contractions every 6-7 minutes. Denies bleeding or leaking. Reports good fetal movement.

## 2014-06-29 NOTE — H&P (Signed)
Rodney CruiseKyahna Pingleton is a 23 y.o. female G2P1001 at 2242w4d presenting for SOL.  Maternal Medical History:  Reason for admission: Nausea.    OB History    Gravida Para Term Preterm AB TAB SAB Ectopic Multiple Living   2 1 1       1      Past Medical History  Diagnosis Date  . Anemia    Past Surgical History  Procedure Laterality Date  . No past surgeries     Family History: family history includes Diabetes in her paternal grandmother. There is no history of Alcohol abuse. Social History:  reports that she has never smoked. She has never used smokeless tobacco. She reports that she does not drink alcohol or use illicit drugs.   Prenatal Transfer Tool  Maternal Diabetes: No Genetic Screening: Declined too late Maternal Ultrasounds/Referrals: Normal Fetal Ultrasounds or other Referrals:  None Maternal Substance Abuse:  No Significant Maternal Medications: none Significant Maternal Lab Results:  Lab values include: Group B Strep positive Other Comments:  None  Review of Systems  Constitutional: Negative.   Eyes: Negative for blurred vision.  Respiratory: Negative for cough.   Gastrointestinal: Positive for abdominal pain. Negative for nausea.  Genitourinary: Negative for dysuria.  Neurological: Negative.  Negative for headaches.  Psychiatric/Behavioral: Negative for depression and substance abuse. The patient is not nervous/anxious.     Dilation: 4.5 Effacement (%): 80 Station: -1 Exam by:: Quintella BatonJo Barham rNC Blood pressure 122/79, pulse 87, temperature 99.1 F (37.3 C), temperature source Oral, resp. rate 18, height 5\' 3"  (1.6 m), weight 66.316 kg (146 lb 3.2 oz), SpO2 98 %. Maternal Exam:  Uterine Assessment: Contraction strength is moderate.  Contraction frequency is regular.   Abdomen: Estimated fetal weight is 7 1/2#.   Fetal presentation: vertex  Introitus: Normal vulva. Normal vagina.  Vagina is negative for discharge.  Ferning test: not done.  Nitrazine test: not  done. Amniotic fluid character: not assessed.  Pelvis: adequate for delivery.   Cervix: Cervix evaluated by digital exam.     Fetal Exam Fetal Monitor Review: Mode: ultrasound.   Baseline rate: 130.  Variability: moderate (6-25 bpm).   Pattern: accelerations present and no decelerations.    Fetal State Assessment: Category I - tracings are normal.     Filed Vitals:   06/29/14 1336  BP: 122/79  Pulse: 87  Temp: 99.1 F (37.3 C)  Resp: 18    Physical Exam  Nursing note and vitals reviewed. Constitutional: She is oriented to person, place, and time. She appears well-developed and well-nourished.  HENT:  Head: Normocephalic.  Eyes: Pupils are equal, round, and reactive to light.  Neck: Normal range of motion. No thyromegaly present.  Cardiovascular: Normal rate, regular rhythm and normal heart sounds.   Respiratory: Effort normal and breath sounds normal.  GI: Soft. She exhibits distension. There is no tenderness.  Genitourinary: Vagina normal and uterus normal. No vaginal discharge found.  Musculoskeletal: Normal range of motion.  Neurological: She is alert and oriented to person, place, and time.  Skin: Skin is warm and dry.  Psychiatric: She has a normal mood and affect. Her behavior is normal.    Prenatal labs: ABO, Rh: --/--/A POS (11/18 1427) Antibody:   Rubella: 1.29 (11/18 1427) RPR: NON REAC (11/18 1427)  HBsAg: NEGATIVE (11/18 1427)  HIV: NONREACTIVE (11/18 1427)  GBS: Positive (11/18 0000)  3 hr OGTT: 70-100-120-108  Assessment/Plan: G2P1001 in early active labor> expectant management GBS carriage> IV PCN   Cashius Grandstaff 06/29/2014,  2:36 PM

## 2014-06-29 NOTE — Progress Notes (Signed)
Deidra Poe CNM notified of pt's admission and status. Aware of ctx pattern and sve. [facility]

## 2014-06-29 NOTE — Anesthesia Procedure Notes (Signed)
Epidural Patient location during procedure: OB Start time: 06/29/2014 3:21 PM End time: 06/29/2014 3:25 PM  Staffing Anesthesiologist: Felipe DroneJUDD, Jaelen Gellerman JENNETTE Performed by: anesthesiologist   Preanesthetic Checklist Completed: patient identified, site marked, surgical consent, pre-op evaluation, timeout performed, IV checked, risks and benefits discussed and monitors and equipment checked  Epidural Patient position: sitting Prep: site prepped and draped and DuraPrep Patient monitoring: continuous pulse ox and blood pressure Approach: midline Location: L3-L4 Injection technique: LOR saline  Needle:  Needle type: Tuohy  Needle gauge: 17 G Needle length: 9 cm and 9 Needle insertion depth: 5 cm cm Catheter type: closed end flexible Catheter size: 19 Gauge Catheter at skin depth: 9 cm Test dose: negative  Assessment Events: blood not aspirated, injection not painful, no injection resistance, negative IV test and no paresthesia

## 2014-06-29 NOTE — MAU Provider Note (Signed)
Julie CruiseKyahna Strong 23 y.o.G2P1001 @[redacted]w[redacted]d  with painful UCs q 5min; FHR reassuring Dilation: 4.5 Effacement (%): 80 Cervical Position: Anterior Station: -1 Presentation: Vertex Exam by:: Quintella BatonJo Barham rNC Will admit  See OB Admision note. Danae Orleanseirdre C Batul Diego, CNM 06/29/2014 2:07 PM

## 2014-06-29 NOTE — Progress Notes (Signed)
Patient ID: Julie Strong, female   DOB: [birth year], 23 y.o.   MRN: [id number] Julie Strong is a 23 y.o. G2P1001 at 3635w4d admitted for SOL  Subjective: Comfortable overall after epidural, but feels pressure on left  Objective: BP 107/67 mmHg  Pulse 88  Temp(Src) 99.1 F (37.3 C) (Oral)  Resp 18  Ht 5\' 3"  (1.6 m)  Wt 66.316 kg (146 lb 3.2 oz)  BMI 25.90 kg/m2  SpO2 100%  Fetal Heart FHR: 150 bpm, variability: moderate,  accelerations:  Present,  decelerations:  Absent   Contractions: q3-4   SVE:   Dilation: 6.5 Effacement (%): 80 Station: -2 Exam by:: Julie Strong, CNM  Assessment / Plan:  Labor: Active phase progressing Fetal Wellbeing: Category 1  Pain Control: adequateExpected mode of delivery: NSVD  Julie Strong 06/29/2014, 4:38 PM

## 2014-06-29 NOTE — Anesthesia Preprocedure Evaluation (Signed)
Anesthesia Evaluation  Patient identified by MRN, date of birth, ID band Patient awake    Reviewed: Allergy & Precautions, H&P , NPO status , Patient's Chart, lab work & pertinent test results  History of Anesthesia Complications Negative for: history of anesthetic complications  Airway Mallampati: II  TM Distance: >3 FB Neck ROM: Full    Dental no notable dental hx. (+) Dental Advisory Given   Pulmonary neg pulmonary ROS,  breath sounds clear to auscultation  Pulmonary exam normal       Cardiovascular negative cardio ROS  Rhythm:Regular Rate:Normal     Neuro/Psych negative neurological ROS  negative psych ROS   GI/Hepatic negative GI ROS, Neg liver ROS,   Endo/Other  negative endocrine ROS  Renal/GU negative Renal ROS  negative genitourinary   Musculoskeletal negative musculoskeletal ROS (+)   Abdominal   Peds negative pediatric ROS (+)  Hematology  (+) anemia ,   Anesthesia Other Findings   Reproductive/Obstetrics (+) Pregnancy                             Anesthesia Physical Anesthesia Plan  ASA: II  Anesthesia Plan: Epidural   Post-op Pain Management:    Induction:   Airway Management Planned:   Additional Equipment:   Intra-op Plan:   Post-operative Plan:   Informed Consent: I have reviewed the patients History and Physical, chart, labs and discussed the procedure including the risks, benefits and alternatives for the proposed anesthesia with the patient or authorized representative who has indicated his/her understanding and acceptance.   Dental advisory given  Plan Discussed with: CRNA  Anesthesia Plan Comments:         Anesthesia Quick Evaluation

## 2014-06-29 NOTE — Progress Notes (Signed)
Patient ID: Julie Strong, female   DOB: [birth year], 23 y.o.   MRN: [id number] Julie Strong is a 23 y.o. G2P1001 at 162w4d admitted for SOL Subjective: Comfortable after epidural, sleeping  Objective: BP 102/61 mmHg  Pulse 66  Temp(Src) 98.4 F (36.9 C) (Oral)  Resp 18  Ht 5\' 3"  (1.6 m)  Wt 66.316 kg (146 lb 3.2 oz)  BMI 25.90 kg/m2  SpO2 100%  Fetal Heart FHR: 120 bpm, variability: moderate,  accelerations:  Present,  decelerations:  Absent   Contractions: q4  SVE:   Dilation: 6.5 Effacement (%): 80 Station: -2 Exam by:: D.Tahani Potier, CNM  Exanm at 1630  Assessment / Plan:  Labor: Active > will allow to rest and check cx at 1930 with consideration for AROM as that will be 4 hr after IV PCN for GBS carriage Fetal Wellbeing: Category 1 Pain Control:  adequate Expected mode of delivery: NSVD  Julie Strong 06/29/2014, 6:37 PM

## 2014-06-30 ENCOUNTER — Encounter: Payer: Self-pay | Admitting: Family

## 2014-06-30 ENCOUNTER — Encounter (HOSPITAL_COMMUNITY): Payer: Self-pay | Admitting: *Deleted

## 2014-06-30 DIAGNOSIS — O99824 Streptococcus B carrier state complicating childbirth: Secondary | ICD-10-CM

## 2014-06-30 DIAGNOSIS — Z3A39 39 weeks gestation of pregnancy: Secondary | ICD-10-CM

## 2014-06-30 LAB — CBC
HEMATOCRIT: 26.5 % — AB (ref 36.0–46.0)
HEMOGLOBIN: 8 g/dL — AB (ref 12.0–15.0)
MCH: 20.9 pg — AB (ref 26.0–34.0)
MCHC: 30.2 g/dL (ref 30.0–36.0)
MCV: 69.4 fL — AB (ref 78.0–100.0)
Platelets: 239 10*3/uL (ref 150–400)
RBC: 3.82 MIL/uL — ABNORMAL LOW (ref 3.87–5.11)
RDW: 17.9 % — AB (ref 11.5–15.5)
WBC: 17.4 10*3/uL — ABNORMAL HIGH (ref 4.0–10.5)

## 2014-06-30 MED ORDER — MISOPROSTOL 200 MCG PO TABS
800.0000 ug | ORAL_TABLET | Freq: Once | ORAL | Status: AC
  Administered 2014-06-30: 800 ug via ORAL

## 2014-06-30 MED ORDER — LANOLIN HYDROUS EX OINT: TOPICAL_OINTMENT | CUTANEOUS | Status: DC | PRN

## 2014-06-30 MED ORDER — PRENATAL MULTIVITAMIN CH
1.0000 | ORAL_TABLET | Freq: Every day | ORAL | Status: DC
  Administered 2014-06-30 – 2014-07-01 (×2): 1 via ORAL
  Filled 2014-06-30 (×2): qty 1

## 2014-06-30 MED ORDER — TETANUS-DIPHTH-ACELL PERTUSSIS 5-2.5-18.5 LF-MCG/0.5 IM SUSP: 0.5000 mL | Freq: Once | INTRAMUSCULAR | Status: DC

## 2014-06-30 MED ORDER — ONDANSETRON HCL 4 MG PO TABS: 4.0000 mg | ORAL_TABLET | ORAL | Status: DC | PRN

## 2014-06-30 MED ORDER — DIPHENHYDRAMINE HCL 25 MG PO CAPS: 25.0000 mg | ORAL_CAPSULE | Freq: Four times a day (QID) | ORAL | Status: DC | PRN

## 2014-06-30 MED ORDER — ONDANSETRON HCL 4 MG/2ML IJ SOLN: 4.0000 mg | INTRAMUSCULAR | Status: DC | PRN

## 2014-06-30 MED ORDER — DIBUCAINE 1 % RE OINT: 1.0000 "application " | TOPICAL_OINTMENT | RECTAL | Status: DC | PRN

## 2014-06-30 MED ORDER — SENNOSIDES-DOCUSATE SODIUM 8.6-50 MG PO TABS
2.0000 | ORAL_TABLET | ORAL | Status: DC
  Administered 2014-06-30: 2 via ORAL
  Filled 2014-06-30: qty 2

## 2014-06-30 MED ORDER — IBUPROFEN 600 MG PO TABS
600.0000 mg | ORAL_TABLET | Freq: Four times a day (QID) | ORAL | Status: DC
  Administered 2014-06-30 – 2014-07-01 (×5): 600 mg via ORAL
  Filled 2014-06-30 (×6): qty 1

## 2014-06-30 MED ORDER — WITCH HAZEL-GLYCERIN EX PADS: 1.0000 "application " | MEDICATED_PAD | CUTANEOUS | Status: DC | PRN

## 2014-06-30 MED ORDER — OXYCODONE-ACETAMINOPHEN 5-325 MG PO TABS: 2.0000 | ORAL_TABLET | ORAL | Status: DC | PRN

## 2014-06-30 MED ORDER — BENZOCAINE-MENTHOL 20-0.5 % EX AERO
1.0000 "application " | INHALATION_SPRAY | CUTANEOUS | Status: DC | PRN
  Administered 2014-06-30: 1 via TOPICAL
  Filled 2014-06-30: qty 56

## 2014-06-30 MED ORDER — SIMETHICONE 80 MG PO CHEW: 80.0000 mg | CHEWABLE_TABLET | ORAL | Status: DC | PRN

## 2014-06-30 MED ORDER — ZOLPIDEM TARTRATE 5 MG PO TABS: 5.0000 mg | ORAL_TABLET | Freq: Every evening | ORAL | Status: DC | PRN

## 2014-06-30 MED ORDER — MISOPROSTOL 200 MCG PO TABS
ORAL_TABLET | ORAL | Status: AC
  Filled 2014-06-30: qty 4

## 2014-06-30 MED ORDER — OXYCODONE-ACETAMINOPHEN 5-325 MG PO TABS: 1.0000 | ORAL_TABLET | ORAL | Status: DC | PRN
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
## 2014-06-30 NOTE — Progress Notes (Signed)
UR chart review completed.  

## 2014-06-30 NOTE — Lactation Note (Signed)
This note was copied from the chart of Julie Rodney CruiseKyahna Adorno. Lactation Consultation Note  P2, Mother states she had an overabundance of milk last time and quit after one month. Suggest mother back off of pumping with this baby if she can and exclusively bf and her body will regulate her supply. Hopes to bf longer with this baby.  Baby sleeping in mother's arms. Mother states baby has breastfed well on both breasts.  Denies soreness or problems. Mom encouraged to feed baby 8-12 times/24 hours and with feeding cues.  Mom made aware of O/P services, breastfeeding support groups, community resources, and our phone # for post-discharge questions.    Patient Name: Julie Strong ZOXWR'UToday's Date: 06/30/2014 Reason for consult: Initial assessment   Maternal Data Has patient been taught Hand Expression?: Yes Does the patient have breastfeeding experience prior to this delivery?: Yes  Feeding Feeding Type: Breast Fed  LATCH Score/Interventions Latch: Grasps breast easily, tongue down, lips flanged, rhythmical sucking.  Audible Swallowing: A few with stimulation  Type of Nipple: Everted at rest and after stimulation  Comfort (Breast/Nipple): Soft / non-tender     Hold (Positioning): Assistance needed to correctly position infant at breast and maintain latch.  LATCH Score: 8  Lactation Tools Discussed/Used     Consult Status Consult Status: Follow-up Date: 07/01/14 Follow-up type: In-patient    Dahlia ByesBerkelhammer, Kassy Mcenroe [facility] 06/30/2014, 2:26 PM

## 2014-06-30 NOTE — Anesthesia Postprocedure Evaluation (Signed)
Anesthesia Post Note  Patient: Julie Strong  Procedure(s) Performed: * No procedures listed *  Anesthesia type: Epidural  Patient location: Mother/Baby  Post pain: Pain level controlled  Post assessment: Post-op Vital signs reviewed  Last Vitals:  Filed Vitals:   06/30/14 0420  BP: 110/62  Pulse: 85  Temp: 37.8 C  Resp: 16    Post vital signs: Reviewed  Level of consciousness: awake  Complications: No apparent anesthesia complications

## 2014-07-01 MED ORDER — IBUPROFEN 600 MG PO TABS: 600.0000 mg | ORAL_TABLET | Freq: Four times a day (QID) | ORAL | Status: DC

## 2014-07-01 NOTE — Discharge Instructions (Signed)

## 2014-07-01 NOTE — Lactation Note (Signed)
This note was copied from the chart of Julie Rodney CruiseKyahna Cornelio. Lactation Consultation Note  Patient Name: Julie Strong [id number]'XToday's Date: 07/01/2014 Reason for consult: Follow-up assessment  Baby is 2333 hours old and per mom has recently breast fed. Nipples are tender. LC assessed breast tissue with moms permission and noted  The nipples to appear healthy color and no break down. Breast appear fuller and the tenderness is hormone related . LC instructed mom on the use comfort gels and provided a package. LC encouraged mom to use her EBM to nipples liberally.  Per mom has a DEBP at home. LC reviewed sore nipple and engorgement prevention and tx . Referring to the Baby and me booklet.  Mother informed of post-discharge support and given phone number to the lactation department, including services for phone call assistance; out-patient  appointments; and breastfeeding support group. List of other breastfeeding resources in the community given in the handout. Encouraged mother to call  for problems or concerns related to breastfeeding.      Maternal Data    Feeding Feeding Type: Breast Fed Length of feed: 20 min  LATCH Score/Interventions Latch: Grasps breast easily, tongue down, lips flanged, rhythmical sucking.  Audible Swallowing: A few with stimulation Intervention(s): Hand expression  Type of Nipple: Everted at rest and after stimulation  Comfort (Breast/Nipple): Filling, red/small blisters or bruises, mild/mod discomfort  Problem noted: Mild/Moderate discomfort Interventions (Mild/moderate discomfort): Hand expression  Hold (Positioning): No assistance needed to correctly position infant at breast. Intervention(s): Breastfeeding basics reviewed  LATCH Score: 8  Lactation Tools Discussed/Used Tools: Comfort gels   Consult Status Consult Status: Complete Date: 07/01/14    Kathrin Greathouseorio, Jacek Colson Ann 07/01/2014, 10:19 AM
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
## 2014-07-20 ENCOUNTER — Encounter: Payer: Self-pay | Admitting: Advanced Practice Midwife

## 2014-08-11 ENCOUNTER — Ambulatory Visit: Payer: Self-pay | Admitting: Family Medicine

## 2014-08-11 ENCOUNTER — Encounter: Payer: Self-pay | Admitting: *Deleted

## 2015-10-08 ENCOUNTER — Ambulatory Visit (HOSPITAL_COMMUNITY)
Admission: EM | Admit: 2015-10-08 | Discharge: 2015-10-08 | Disposition: A | Discharge status: Home / Self Care | Payer: No Typology Code available for payment source | Source: Ambulatory Visit | Attending: Emergency Medicine | Admitting: Emergency Medicine

## 2015-10-08 ENCOUNTER — Encounter (HOSPITAL_COMMUNITY): Payer: Self-pay | Admitting: Emergency Medicine

## 2015-10-08 ENCOUNTER — Emergency Department (HOSPITAL_COMMUNITY)
Admission: EM | Admit: 2015-10-08 | Discharge: 2015-10-08 | Disposition: A | Discharge status: Home / Self Care | Payer: Medicaid Other | Attending: Emergency Medicine | Admitting: Emergency Medicine

## 2015-10-08 DIAGNOSIS — Z862 Personal history of diseases of the blood and blood-forming organs and certain disorders involving the immune mechanism: Secondary | ICD-10-CM | POA: Insufficient documentation

## 2015-10-08 DIAGNOSIS — Z0441 Encounter for examination and observation following alleged adult rape: Secondary | ICD-10-CM | POA: Diagnosis present

## 2015-10-08 DIAGNOSIS — T7421XA Adult sexual abuse, confirmed, initial encounter: Secondary | ICD-10-CM

## 2015-10-08 DIAGNOSIS — Z79899 Other long term (current) drug therapy: Secondary | ICD-10-CM | POA: Insufficient documentation

## 2015-10-08 MED ORDER — ULIPRISTAL ACETATE 30 MG PO TABS
30.0000 mg | ORAL_TABLET | Freq: Once | ORAL | Status: AC
  Administered 2015-10-08: 30 mg via ORAL

## 2015-10-08 MED ORDER — PROMETHAZINE HCL 25 MG PO TABS
25.0000 mg | ORAL_TABLET | Freq: Four times a day (QID) | ORAL | Status: DC | PRN
  Administered 2015-10-08: 25 mg via ORAL

## 2015-10-08 MED ORDER — METRONIDAZOLE 500 MG PO TABS
2000.0000 mg | ORAL_TABLET | Freq: Once | ORAL | Status: AC
  Administered 2015-10-08: 2000 mg via ORAL

## 2015-10-08 MED ORDER — CEFTRIAXONE SODIUM 250 MG IJ SOLR
250.0000 mg | Freq: Once | INTRAMUSCULAR | Status: AC
  Administered 2015-10-08: 250 mg via INTRAMUSCULAR

## 2015-10-08 MED ORDER — LIDOCAINE HCL (PF) 1 % IJ SOLN
0.9000 mL | Freq: Once | INTRAMUSCULAR | Status: AC
  Administered 2015-10-08: 0.9 mL

## 2015-10-08 MED ORDER — AZITHROMYCIN 250 MG PO TABS
1000.0000 mg | ORAL_TABLET | Freq: Once | ORAL | Status: AC
  Administered 2015-10-08: 1000 mg via ORAL
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
## 2015-10-08 NOTE — ED Notes (Signed)
SANE RN in ED to see patient.

## 2015-10-08 NOTE — ED Notes (Signed)
SANE RN at bedside to take patient for the rest of her exam. PA Rose made aware and advised patient is cleared to go with SANE RN at this time.
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
## 2015-10-10 LAB — POC URINE PREG, ED: Preg Test, Ur: NEGATIVE

## 2015-10-24 ENCOUNTER — Encounter: Payer: Self-pay | Admitting: Student

## 2015-10-24 ENCOUNTER — Ambulatory Visit (INDEPENDENT_AMBULATORY_CARE_PROVIDER_SITE_OTHER): Payer: Self-pay | Admitting: Student

## 2015-10-24 VITALS — BP 104/60 | HR 76 | Wt 115.0 lb

## 2015-10-24 DIAGNOSIS — T7421XA Adult sexual abuse, confirmed, initial encounter: Secondary | ICD-10-CM | POA: Insufficient documentation

## 2015-10-24 DIAGNOSIS — T7421XD Adult sexual abuse, confirmed, subsequent encounter: Secondary | ICD-10-CM

## 2015-10-24 DIAGNOSIS — Z113 Encounter for screening for infections with a predominantly sexual mode of transmission: Secondary | ICD-10-CM

## 2015-10-24 DIAGNOSIS — Z30011 Encounter for initial prescription of contraceptive pills: Secondary | ICD-10-CM

## 2015-10-24 HISTORY — DX: Adult sexual abuse, confirmed, initial encounter: T74.21XA

## 2015-10-24 LAB — HIV ANTIBODY (ROUTINE TESTING W REFLEX): HIV 1&2 Ab, 4th Generation: NONREACTIVE

## 2015-10-24 MED ORDER — NORGESTIMATE-ETH ESTRADIOL 0.25-35 MG-MCG PO TABS: 1.0000 | ORAL_TABLET | Freq: Every day | ORAL | Status: DC

## 2015-10-24 NOTE — Patient Instructions (Signed)
[facility]   Francoise CeoBernard Marshall      Phone: [phone]  [facility]     Phone: [phone]  [facility] [place]  Phone: [phone]  [facility] [place]  Phone: [phone]  [facility]    Phone: [phone]   [facility]    Phone: [phone]  [facility]    Phone: [phone]  [facility]    Phone: [phone]  [facility]    Phone: [phone]  [facility] Phone: [phone]   [facility]  Phone: [phone]  [facility]    Phone: [phone]  [facility]   Phone: [phone]  [facility]      Phone: [phone]  [facility]    Phone: [phone]  [facility]     Phone: [phone]   Oral Contraception Use Oral contraceptive pills (OCPs) are medicines taken to prevent pregnancy. OCPs work by preventing the ovaries from releasing eggs. The hormones in OCPs also cause the cervical mucus to thicken, preventing the sperm from entering the uterus. The hormones also cause the uterine lining to become thin, not allowing a fertilized egg to attach to the inside of the uterus. OCPs are highly effective when taken exactly as prescribed. However, OCPs do not prevent sexually transmitted diseases (STDs). Safe sex practices, such as using condoms along with an OCP, can help prevent STDs. Before taking OCPs, you may have a physical exam and Pap test. Your health care provider may also order blood tests if necessary. Your health care provider will make sure you are a good candidate for oral contraception. Discuss with your health care provider the possible side effects of the OCP you may be prescribed. When starting an OCP,  it can take 2 to 3 months for the body to adjust to the changes in hormone levels in your body.  HOW TO TAKE ORAL CONTRACEPTIVE PILLS Your health care provider may advise you on how to start taking the first cycle of OCPs. Otherwise, you can:   Start on day 1 of your menstrual period. You will not need any backup contraceptive protection with this start time.   Start on the first Sunday after your menstrual period or the day you get your prescription. In these cases, you will need to use backup contraceptive protection for the first week.   Start the pill at any time of your cycle. If you take the pill within 5 days of the start of your period, you are protected against pregnancy right away. In this case, you will not need a backup form of birth control. If you start at any other time of your menstrual cycle, you will need to use another form of birth control for 7 days. If your OCP is the type called a minipill, it will protect you from pregnancy after taking it for 2 days (48 hours). After you have started taking OCPs:   If you forget to take 1 pill, take it as soon as you remember. Take the next pill at the regular time.   If you miss 2 or more pills, call your health care provider because different pills have different instructions for missed doses. Use backup birth control until your next menstrual period starts.   If you use a 28-day pack that contains inactive pills and you miss 1 of the  last 7 pills (pills with no hormones), it will not matter. Throw away the rest of the non-hormone pills and start a new pill pack.  No matter which day you start the OCP, you will always start a new pack on that same day of the week. Have an extra pack of OCPs and a backup contraceptive method available in case you miss some pills or lose your OCP pack.  HOME CARE INSTRUCTIONS   Do not smoke.   Always use a condom to protect against STDs. OCPs do not protect against STDs.   Use a calendar to mark  your menstrual period days.   Read the information and directions that came with your OCP. Talk to your health care provider if you have questions.  SEEK MEDICAL CARE IF:   You develop nausea and vomiting.   You have abnormal vaginal discharge or bleeding.   You develop a rash.   You miss your menstrual period.   You are losing your hair.   You need treatment for mood swings or depression.   You get dizzy when taking the OCP.   You develop acne from taking the OCP.   You become pregnant.  SEEK IMMEDIATE MEDICAL CARE IF:   You develop chest pain.   You develop shortness of breath.   You have an uncontrolled or severe headache.   You develop numbness or slurred speech.   You develop visual problems.   You develop pain, redness, and swelling in the legs.    This information is not intended to replace advice given to you by your health care provider. Make sure you discuss any questions you have with your health care provider.   Document Released: 06/14/2011 Document Revised: 07/16/2014 Document Reviewed: 12/14/2012 Elsevier Interactive Patient Education [employer]
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
## 2015-10-25 LAB — WET PREP, GENITAL
Trich, Wet Prep: NONE SEEN
Yeast Wet Prep HPF POC: NONE SEEN

## 2015-10-25 LAB — HCG, QUANTITATIVE, PREGNANCY

## 2015-10-25 LAB — RPR

## 2015-10-25 LAB — GC/CHLAMYDIA PROBE AMP (~~LOC~~) NOT AT ARMC
CHLAMYDIA, DNA PROBE: NEGATIVE
NEISSERIA GONORRHEA: NEGATIVE

## 2015-10-27 ENCOUNTER — Telehealth: Payer: Self-pay | Admitting: General Practice

## 2015-10-27 NOTE — Telephone Encounter (Signed)
Per Denny PeonErin, all std testing & pregnancy test were negative. Called and informed patient. Patient verbalized understanding & had no questions

## 2017-03-09 ENCOUNTER — Inpatient Hospital Stay (HOSPITAL_COMMUNITY)
Admission: AD | Admit: 2017-03-09 | Discharge: 2017-03-09 | Disposition: A | Discharge status: Home / Self Care | Payer: Self-pay | Source: Ambulatory Visit | Attending: Obstetrics & Gynecology | Admitting: Obstetrics & Gynecology

## 2017-03-09 ENCOUNTER — Inpatient Hospital Stay (HOSPITAL_COMMUNITY): Payer: Self-pay

## 2017-03-09 ENCOUNTER — Encounter (HOSPITAL_COMMUNITY): Payer: Self-pay | Admitting: *Deleted

## 2017-03-09 DIAGNOSIS — O26891 Other specified pregnancy related conditions, first trimester: Secondary | ICD-10-CM | POA: Insufficient documentation

## 2017-03-09 DIAGNOSIS — O3680X0 Pregnancy with inconclusive fetal viability, not applicable or unspecified: Secondary | ICD-10-CM

## 2017-03-09 DIAGNOSIS — O209 Hemorrhage in early pregnancy, unspecified: Secondary | ICD-10-CM | POA: Insufficient documentation

## 2017-03-09 DIAGNOSIS — Z3A01 Less than 8 weeks gestation of pregnancy: Secondary | ICD-10-CM | POA: Insufficient documentation

## 2017-03-09 DIAGNOSIS — R109 Unspecified abdominal pain: Secondary | ICD-10-CM | POA: Insufficient documentation

## 2017-03-09 LAB — URINALYSIS, ROUTINE W REFLEX MICROSCOPIC
Bilirubin Urine: NEGATIVE
Glucose, UA: NEGATIVE mg/dL
Ketones, ur: NEGATIVE mg/dL
Leukocytes, UA: NEGATIVE
Nitrite: NEGATIVE
PROTEIN: 30 mg/dL — AB
SPECIFIC GRAVITY, URINE: 1.028 (ref 1.005–1.030)
SQUAMOUS EPITHELIAL / LPF: NONE SEEN
pH: 6 (ref 5.0–8.0)

## 2017-03-09 LAB — HCG, QUANTITATIVE, PREGNANCY: HCG, BETA CHAIN, QUANT, S: 591 m[IU]/mL — AB (ref <5)

## 2017-03-09 LAB — POCT PREGNANCY, URINE: PREG TEST UR: POSITIVE — AB

## 2017-03-09 NOTE — MAU Note (Signed)
Had light bleeding Friday. More bleeding with clots today. Some abd cramping today. LMP unknown. Had period beginning of July

## 2017-03-09 NOTE — Discharge Instructions (Signed)
Ectopic Pregnancy An ectopic pregnancy is when the fertilized egg attaches (implants) outside the uterus. Most ectopic pregnancies occur in one of the tubes where eggs travel from the ovary to the uterus (fallopian tubes), but the implanting can occur in other locations. In rare cases, ectopic pregnancies occur on the ovary, intestine, pelvis, abdomen, or cervix. In an ectopic pregnancy, the fertilized egg does not have the ability to develop into a normal, healthy baby. A ruptured ectopic pregnancy is one in which tearing or bursting of a fallopian tube causes internal bleeding. Often, there is intense lower abdominal pain, and vaginal bleeding sometimes occurs. Having an ectopic pregnancy can be life-threatening. If this dangerous condition is not treated, it can lead to blood loss, shock, or even death. What are the causes? The most common cause of this condition is damage to one of the fallopian tubes. A fallopian tube may be narrowed or blocked, and that keeps the fertilized egg from reaching the uterus. What increases the risk? This condition is more likely to develop in women of childbearing age who have different levels of risk. The levels of risk can be divided into three categories. High risk  You have gone through infertility treatment.  You have had an ectopic pregnancy before.  You have had surgery on the fallopian tubes, or another surgical procedure, such as an abortion.  You have had surgery to have the fallopian tubes tied (tubal ligation).  You have problems or diseases of the fallopian tubes.  You have been exposed to diethylstilbestrol (DES). This medicine was used until 1971, and it had effects on babies whose mothers took the medicine.  You become pregnant while using an IUD (intrauterine device) for birth control. Moderate risk  You have a history of infertility.  You have had an STI (sexually transmitted infection).  You have a history of pelvic inflammatory  disease (PID).  You have scarring from endometriosis.  You have multiple sexual partners.  You smoke. Low risk  You have had pelvic surgery.  You use vaginal douches.  You became sexually active before age 18. What are the signs or symptoms? Common symptoms of this condition include normal pregnancy symptoms, such as missing a period, nausea, tiredness, abdominal pain, breast tenderness, and bleeding. However, ectopic pregnancy will have additional symptoms, such as:  Pain with intercourse.  Irregular vaginal bleeding or spotting.  Cramping or pain on one side or in the lower abdomen.  Fast heartbeat, low blood pressure, and sweating.  Passing out while having a bowel movement.  Symptoms of a ruptured ectopic pregnancy and internal bleeding may include:  Sudden, severe pain in the abdomen and pelvis.  Dizziness, weakness, light-headedness, or fainting.  Pain in the shoulder or neck area.  How is this diagnosed? This condition is diagnosed by:  A pelvic exam to locate pain or a mass in the abdomen.  A pregnancy test. This blood test checks for the presence as well as the specific level of pregnancy hormone in the bloodstream.  Ultrasound. This is performed if a pregnancy test is positive. In this test, a probe is inserted into the vagina. The probe will detect a fetus, possibly in a location other than the uterus.  Taking a sample of uterus tissue (dilation and curettage, or D&C).  Surgery to perform a visual exam of the inside of the abdomen using a thin, lighted tube that has a tiny camera on the end (laparoscope).  Culdocentesis. This procedure involves inserting a needle at the top   of the vagina, behind the uterus. If blood is present in this area, it may indicate that a fallopian tube is torn.  How is this treated? This condition is treated with medicine or surgery. Medicine  An injection of a medicine (methotrexate) may be given to cause the pregnancy tissue  to be absorbed. This medicine may save your fallopian tube. It may be given if: ? The diagnosis is made early, with no signs of active bleeding. ? The fallopian tube has not ruptured. ? You are considered to be a good candidate for the medicine. Usually, pregnancy hormone blood levels are checked after methotrexate treatment. This is to be sure that the medicine is effective. It may take 4-6 weeks for the pregnancy to be absorbed. Most pregnancies will be absorbed by 3 weeks. Surgery  A laparoscope may be used to remove the pregnancy tissue.  If severe internal bleeding occurs, a larger cut (incision) may be made in the lower abdomen (laparotomy) to remove the fetus and placenta. This is done to stop the bleeding.  Part or all of the fallopian tube may be removed (salpingectomy) along with the fetus and placenta. The fallopian tube may also be repaired during the surgery.  In very rare circumstances, removal of the uterus (hysterectomy) may be required.  After surgery, pregnancy hormone testing may be done to be sure that there is no pregnancy tissue left. Whether your treatment is medicine or surgery, you may receive a Rho (D) immune globulin shot to prevent problems with any future pregnancy. This shot may be given if:  You are Rh-negative and the baby's father is Rh-positive.  You are Rh-negative and you do not know the Rh type of the baby's father.  Follow these instructions at home:  Rest and limit your activity after the procedure for as long as told by your health care provider.  Until your health care provider says that it is safe: ? Do not lift anything that is heavier than 10 lb (4.5 kg), or the limit that your health care provider tells you. ? Avoid physical exercise and any movement that requires effort (is strenuous).  To help prevent constipation: ? Eat a healthy diet that includes fruits, vegetables, and whole grains. ? Drink 6-8 glasses of water per day. Get help  right away if:  You develop worsening pain that is not relieved by medicine.  You have: ? A fever or chills. ? Vaginal bleeding. ? Redness and swelling at the incision site. ? Nausea and vomiting.  You feel dizzy or weak.  You feel light-headed or you faint. This information is not intended to replace advice given to you by your health care provider. Make sure you discuss any questions you have with your health care provider. Document Released: 08/02/2004 Document Revised: 02/22/2016 Document Reviewed: 01/25/2016 Elsevier Interactive Patient Education  2018 [employer]  

## 2017-03-09 NOTE — MAU Provider Note (Signed)
History   G2P2002 @ 7.4 wks per LMP of 01/15/17 in with c/o cramping 3/10 and vag bleeding since yesterday. States spotting yesterday but has gotten heavier today to where she has to wear a pad.  CSN: [id number]  Arrival date & time 03/09/17  2032   None     Chief Complaint  Patient presents with  . Vaginal Bleeding  . Abdominal Cramping    HPI  Past Medical History:  Diagnosis Date  . Anemia     Past Surgical History:  Procedure Laterality Date  . NO PAST SURGERIES      Family History  Problem Relation Age of Onset  . Alcohol abuse Neg Hx   . Diabetes Paternal Grandmother     Social History  Substance Use Topics  . Smoking status: Never Smoker  . Smokeless tobacco: Never Used  . Alcohol use No    OB History    Gravida Para Term Preterm AB Living   3 2 2     2    SAB TAB Ectopic Multiple Live Births         0 2      Review of Systems  Constitutional: Negative.   HENT: Negative.   Eyes: Negative.   Respiratory: Negative.   Cardiovascular: Negative.   Gastrointestinal: Positive for abdominal pain.  Endocrine: Negative.   Genitourinary: Positive for vaginal bleeding.  Musculoskeletal: Negative.   Skin: Negative.   Allergic/Immunologic: Negative.   Neurological: Negative.   Hematological: Negative.   Psychiatric/Behavioral: Negative.     Allergies  Patient has no known allergies.  Home Medications    BP 118/77 (BP Location: Right Arm)   Pulse 72   Temp 98.4 F (36.9 C)   Resp 18   Ht 5' 1.5" (1.562 m)   Wt 125 lb (56.7 kg)   BMI 23.24 kg/m   Physical Exam  Constitutional: She is oriented to person, place, and time. She appears well-developed and well-nourished.  HENT:  Head: Normocephalic.  Eyes: Pupils are equal, round, and reactive to light.  Neck: Normal range of motion.  Cardiovascular: Normal rate, regular rhythm, normal heart sounds and intact distal pulses.   Pulmonary/Chest: Effort normal and breath sounds normal.  Abdominal:  Soft. Bowel sounds are normal.  Genitourinary: Vagina normal and uterus normal.  Musculoskeletal: Normal range of motion.  Neurological: She is alert and oriented to person, place, and time. She has normal reflexes.  Skin: Skin is warm and dry.  Psychiatric: She has a normal mood and affect. Her behavior is normal. Judgment and thought content normal.   Results for orders placed or performed during the hospital encounter of 03/09/17 (from the past 24 hour(s))  Urinalysis, Routine w reflex microscopic     Status: Abnormal   Collection Time: 03/09/17  8:40 PM  Result Value Ref Range   Color, Urine YELLOW YELLOW   APPearance HAZY (A) CLEAR   Specific Gravity, Urine 1.028 1.005 - 1.030   pH 6.0 5.0 - 8.0   Glucose, UA NEGATIVE NEGATIVE mg/dL   Hgb urine dipstick LARGE (A) NEGATIVE   Bilirubin Urine NEGATIVE NEGATIVE   Ketones, ur NEGATIVE NEGATIVE mg/dL   Protein, ur 30 (A) NEGATIVE mg/dL   Nitrite NEGATIVE NEGATIVE   Leukocytes, UA NEGATIVE NEGATIVE   RBC / HPF TOO NUMEROUS TO COUNT 0 - 5 RBC/hpf   WBC, UA 0-5 0 - 5 WBC/hpf   Bacteria, UA RARE (A) NONE SEEN   Squamous Epithelial / LPF NONE SEEN NONE SEEN  Mucus PRESENT   Pregnancy, urine POC     Status: Abnormal   Collection Time: 03/09/17  8:50 PM  Result Value Ref Range   Preg Test, Ur POSITIVE (A) NEGATIVE  hCG, quantitative, pregnancy     Status: Abnormal   Collection Time: 03/09/17  9:01 PM  Result Value Ref Range   hCG, Beta Chain, Quant, S 591 (H) <5 mIU/mL   [place] Ob Comp Less 14 Wks  Result Date: 03/09/2017 CLINICAL DATA:  Vaginal bleeding, positive urine pregnancy test EXAM: OBSTETRIC <14 WK US AND TRANSVAGINAL OB US TECHNIQUE: Both transabdominal and transvaginal ultrasound examinations were performed for complete evaluation of the gestation as well as the maternal uterus, adnexal regions, and pelvic cul-de-sac. Transvaginal technique was performed to assess early pregnancy. COMPARISON:  None. FINDINGS: Intrauterine  gestational sac: Not visualized Yolk sac:  Not visualized Embryo:  Not visualized Maternal uterus/adnexae: Bilateral ovaries are within normal limits. Right ovary measures 3.1 by 4.8 x 2.9 cm. The left ovary measures 2.2 by 3.7 x 2 cm. Trace free fluid in the pelvis. Heterogenous echogenic endometrioma thickening. IMPRESSION: 1. No intrauterine pregnancy is visualized. Findings consistent with pregnancy of unknown location, differential of which includes IUP too early to visualize, recent failed pregnancy, and occult ectopic. Recommend trending of HCG with follow-up ultrasound as indicated. Heterogenous echogenic thickening of the endometrium, cannot exclude hemorrhagic material within the lower uterine segment 2. Trace free fluid Electronically Signed   By: Jasmine PangKim  Fujinaga M.D.   On: 03/09/2017 21:46   [place] Ob Transvaginal  Result Date: 03/09/2017 CLINICAL DATA:  Vaginal bleeding, positive urine pregnancy test EXAM: OBSTETRIC <14 WK US AND TRANSVAGINAL OB US TECHNIQUE: Both transabdominal and transvaginal ultrasound examinations were performed for complete evaluation of the gestation as well as the maternal uterus, adnexal regions, and pelvic cul-de-sac. Transvaginal technique was performed to assess early pregnancy. COMPARISON:  None. FINDINGS: Intrauterine gestational sac: Not visualized Yolk sac:  Not visualized Embryo:  Not visualized Maternal uterus/adnexae: Bilateral ovaries are within normal limits. Right ovary measures 3.1 by 4.8 x 2.9 cm. The left ovary measures 2.2 by 3.7 x 2 cm. Trace free fluid in the pelvis. Heterogenous echogenic endometrioma thickening. IMPRESSION: 1. No intrauterine pregnancy is visualized. Findings consistent with pregnancy of unknown location, differential of which includes IUP too early to visualize, recent failed pregnancy, and occult ectopic. Recommend trending of HCG with follow-up ultrasound as indicated. Heterogenous echogenic thickening of the endometrium, cannot exclude  hemorrhagic material within the lower uterine segment 2. Trace free fluid Electronically Signed   By: Jasmine PangKim  Fujinaga M.D.   On: 03/09/2017 21:46   MAU Course  Procedures (including critical care time)  Labs Reviewed  POCT PREGNANCY, URINE - Abnormal; Notable for the following:       Result Value   Preg Test, Ur POSITIVE (*)    All other components within normal limits  URINALYSIS, ROUTINE W REFLEX MICROSCOPIC  HCG, QUANTITATIVE, PREGNANCY   No results found.   1. Vaginal bleeding in pregnancy, first trimester       MDM  VSS, having scant amt pinkish red vag bleeding.  Pt care turned over to [employer] CNM   1. Pregnancy of unknown anatomic location   2. Vaginal bleeding in pregnancy, first trimester   3. Abdominal pain in pregnancy, first trimester    DC home Comfort measures reviewed  1st Trimester precautions  Bleeding precautions Ectopic precautions RX: none  Return to MAU as needed FU with OB as planned  Follow-up Information    [facility] for [facility] up.   Specialty:  Obstetrics and Gynecology Why:  TUESDAY 03/12/17 BETWEEN 8-11AM FOR REPEAT BLOOD WORK  Contact information: [address] [place] [address] [phone]

## 2017-03-12 ENCOUNTER — Encounter: Payer: Self-pay | Admitting: General Practice

## 2017-03-12 ENCOUNTER — Telehealth: Payer: Self-pay | Admitting: General Practice

## 2017-03-12 ENCOUNTER — Ambulatory Visit: Payer: Self-pay | Admitting: General Practice

## 2017-03-12 ENCOUNTER — Other Ambulatory Visit: Payer: Self-pay | Admitting: Medical

## 2017-03-12 DIAGNOSIS — O3680X0 Pregnancy with inconclusive fetal viability, not applicable or unspecified: Secondary | ICD-10-CM

## 2017-03-12 LAB — HCG, QUANTITATIVE, PREGNANCY: hCG, Beta Chain, Quant, S: 108 m[IU]/mL — ABNORMAL HIGH (ref <5)

## 2017-03-12 NOTE — Progress Notes (Addendum)
Patient here for stat bhcg. Patient reports continued bleeding over the weekend like a period, not really cramping. Discussed with patient that we would like for her to wait in lobby for results & updated plan of care. Patient states she cannot stay because she has to get back to work. Told patient we could give her a letter excusing her from work today. Patient states she just started the job in the past week and literally cannot miss any time. Patient provided number [phone] and states she can be reached there. Told patient I will call her around 2-230pm with results. Patient verbalized understanding to all and had no questions. Reviewed results/history with Raynelle FanningJulie who states decreasing levels are consistent with SAB. Patient should have follow up level in one week & provider visit in two weeks. Will call patient with results

## 2017-03-12 NOTE — Telephone Encounter (Signed)
Called patient with bhcg and recommendation to follow up in one week for labs and two weeks for provider visit. Patient verbalized understanding to all & states she can come 9/11 @ 1130. Patient had no questions

## 2017-03-19 ENCOUNTER — Other Ambulatory Visit: Payer: Self-pay

## 2017-07-22 ENCOUNTER — Encounter (HOSPITAL_COMMUNITY): Payer: Self-pay | Admitting: *Deleted

## 2017-07-22 ENCOUNTER — Inpatient Hospital Stay (HOSPITAL_COMMUNITY)
Admission: AD | Admit: 2017-07-22 | Discharge: 2017-07-22 | Disposition: A | Discharge status: Home / Self Care | Payer: Self-pay | Source: Ambulatory Visit | Attending: Obstetrics and Gynecology | Admitting: Obstetrics and Gynecology

## 2017-07-22 ENCOUNTER — Inpatient Hospital Stay (HOSPITAL_COMMUNITY): Payer: Self-pay

## 2017-07-22 DIAGNOSIS — O200 Threatened abortion: Secondary | ICD-10-CM

## 2017-07-22 DIAGNOSIS — Z3A10 10 weeks gestation of pregnancy: Secondary | ICD-10-CM | POA: Insufficient documentation

## 2017-07-22 DIAGNOSIS — O209 Hemorrhage in early pregnancy, unspecified: Secondary | ICD-10-CM

## 2017-07-22 LAB — URINALYSIS, ROUTINE W REFLEX MICROSCOPIC
Bilirubin Urine: NEGATIVE
GLUCOSE, UA: NEGATIVE mg/dL
Ketones, ur: NEGATIVE mg/dL
Nitrite: NEGATIVE
PH: 5 (ref 5.0–8.0)
PROTEIN: 30 mg/dL — AB
Specific Gravity, Urine: 1.026 (ref 1.005–1.030)

## 2017-07-22 LAB — WET PREP, GENITAL
CLUE CELLS WET PREP: NONE SEEN
Sperm: NONE SEEN
Trich, Wet Prep: NONE SEEN

## 2017-07-22 LAB — CBC
HCT: 34 % — ABNORMAL LOW (ref 36.0–46.0)
Hemoglobin: 10.8 g/dL — ABNORMAL LOW (ref 12.0–15.0)
MCH: 23.3 pg — AB (ref 26.0–34.0)
MCHC: 31.8 g/dL (ref 30.0–36.0)
MCV: 73.4 fL — ABNORMAL LOW (ref 78.0–100.0)
Platelets: 280 10*3/uL (ref 150–400)
RBC: 4.63 MIL/uL (ref 3.87–5.11)
RDW: 18.2 % — AB (ref 11.5–15.5)
WBC: 9.6 10*3/uL (ref 4.0–10.5)

## 2017-07-22 LAB — POCT PREGNANCY, URINE: Preg Test, Ur: POSITIVE — AB

## 2017-07-22 LAB — HCG, QUANTITATIVE, PREGNANCY: HCG, BETA CHAIN, QUANT, S: 60588 m[IU]/mL — AB (ref <5)

## 2017-07-22 NOTE — Discharge Instructions (Signed)
Return to Maternity Admissions Unit for any increased bleeding or pain.

## 2017-07-22 NOTE — MAU Provider Note (Signed)
History     CSN: [id number]  Arrival date and time: 07/22/17 2130   First Provider Initiated Contact with Patient 07/22/17 1025      Chief Complaint  Patient presents with  . Vaginal Bleeding   HPI  Ms.  Julie Strong is a 27 y.o. year old G67P2022 female at 10.[redacted] weeks gestation by LMP who presents to MAU reporting vaginal spotting noted this morning. She went to the [facility] on 07/20/2017 for pregnancy confirmation. She had an U/S and was told "there is a YS, but no embryo to measure or cardiac activity". She has been scheduled to have a repeat U/S in 2 wks, but was "told to come here if any spotting occurred". She denies any recent SI; last was 5 days ago.   Past Medical History:  Diagnosis Date  . Anemia     Past Surgical History:  Procedure Laterality Date  . NO PAST SURGERIES      Family History  Problem Relation Age of Onset  . Diabetes Paternal Grandmother   . Alcohol abuse Neg Hx     Social History   Tobacco Use  . Smoking status: Never Smoker  . Smokeless tobacco: Never Used  Substance Use Topics  . Alcohol use: No  . Drug use: No    Allergies: No Known Allergies  No medications prior to admission.    Review of Systems  Constitutional: Negative.   HENT: Negative.   Eyes: Negative.   Respiratory: Negative.   Cardiovascular: Negative.   Gastrointestinal: Negative.   Endocrine: Negative.   Genitourinary: Positive for vaginal bleeding (spottinig).  Musculoskeletal: Negative.   Skin: Negative.   Allergic/Immunologic: Negative.   Neurological: Negative.   Hematological: Negative.   Psychiatric/Behavioral: Negative.    Physical Exam   Blood pressure 123/81, pulse 74, temperature 98 F (36.7 C), temperature source Oral, resp. rate 18, height 5\' 1"  (1.549 m), weight 123 lb 4 oz (55.9 kg), last menstrual period 05/09/2017, SpO2 100 %.  Physical Exam  Nursing note and vitals reviewed. Constitutional: She is oriented to  person, place, and time. She appears well-developed and well-nourished.  HENT:  Head: Normocephalic.  Eyes: Pupils are equal, round, and reactive to light.  Neck: Normal range of motion.  Cardiovascular: Normal rate, regular rhythm and normal heart sounds.  Respiratory: Effort normal and breath sounds normal.  GI: Soft. Bowel sounds are normal.  Genitourinary:  Genitourinary Comments: Uterus: non-tender, cx: smooth, pink, no lesions, moderate amt thick, cottage cheese-like white, adherent vaginal d/c, closed/long/firm, no CMT, (+) friability with insertion of the speculum, no adnexal tenderness   Musculoskeletal: Normal range of motion.  Neurological: She is alert and oriented to person, place, and time.  Skin: Skin is warm and dry.  Psychiatric: She has a normal mood and affect. Her behavior is normal. Judgment and thought content normal.    MAU Course  Procedures  MDM CCUA UPT CBC HCG OB <14 wks Comp U/S OB < 14 wks TVUS Wet Prep GC/CT -- pending HIV -- pending *Consult with Dr. Shawnie Pons @ 1315 - notified of patient's complaints, assessments, lab & U/S results, reviewed U/S images and recommends repeat U/S in 1 week.  Results for orders placed or performed during the hospital encounter of 07/22/17 (from the past 24 hour(s))  Urinalysis, Routine w reflex microscopic     Status: Abnormal   Collection Time: 07/22/17  9:58 AM  Result Value Ref Range   Color, Urine YELLOW YELLOW   APPearance CLOUDY (  A) CLEAR   Specific Gravity, Urine 1.026 1.005 - 1.030   pH 5.0 5.0 - 8.0   Glucose, UA NEGATIVE NEGATIVE mg/dL   Hgb urine dipstick MODERATE (A) NEGATIVE   Bilirubin Urine NEGATIVE NEGATIVE   Ketones, ur NEGATIVE NEGATIVE mg/dL   Protein, ur 30 (A) NEGATIVE mg/dL   Nitrite NEGATIVE NEGATIVE   Leukocytes, UA LARGE (A) NEGATIVE   RBC / HPF 6-30 0 - 5 RBC/hpf   WBC, UA TOO NUMEROUS TO COUNT 0 - 5 WBC/hpf   Bacteria, UA RARE (A) NONE SEEN   Squamous Epithelial / LPF TOO NUMEROUS TO  COUNT (A) NONE SEEN   Mucus PRESENT   Pregnancy, urine POC     Status: Abnormal   Collection Time: 07/22/17 10:41 AM  Result Value Ref Range   Preg Test, Ur POSITIVE (A) NEGATIVE  Wet prep, genital     Status: Abnormal   Collection Time: 07/22/17 10:45 AM  Result Value Ref Range   Yeast Wet Prep HPF POC PRESENT (A) NONE SEEN   Trich, Wet Prep NONE SEEN NONE SEEN   Clue Cells Wet Prep HPF POC NONE SEEN NONE SEEN   WBC, Wet Prep HPF POC MANY (A) NONE SEEN   Sperm NONE SEEN   CBC     Status: Abnormal   Collection Time: 07/22/17 11:53 AM  Result Value Ref Range   WBC 9.6 4.0 - 10.5 K/uL   RBC 4.63 3.87 - 5.11 MIL/uL   Hemoglobin 10.8 (L) 12.0 - 15.0 g/dL   HCT 16.134.0 (L) 09.636.0 - 04.546.0 %   MCV 73.4 (L) 78.0 - 100.0 fL   MCH 23.3 (L) 26.0 - 34.0 pg   MCHC 31.8 30.0 - 36.0 g/dL   RDW 40.918.2 (H) 81.111.5 - 91.415.5 %   Platelets 280 150 - 400 K/uL    [place] Ob Comp Less 14 Wks  Result Date: 07/22/2017 CLINICAL DATA:  Bleeding EXAM: OBSTETRIC <14 WK US AND TRANSVAGINAL OB US TECHNIQUE: Both transabdominal and transvaginal ultrasound examinations were performed for complete evaluation of the gestation as well as the maternal uterus, adnexal regions, and pelvic cul-de-sac. Transvaginal technique was performed to assess early pregnancy. COMPARISON:  None. FINDINGS: Intrauterine gestational sac: Irregularly shaped single intrauterine gestational sac Yolk sac:  Questionable, not well visualized. Embryo:  Questionable, not well visualized Cardiac Activity: Not visualized Heart Rate:   bpm MSD: 17.1 mm   6 w   4 d CRL:  4.3 mm   6 w   1 d                  US EDC: 03/16/2018 Subchorionic hemorrhage:  Small subchorionic hemorrhage Maternal uterus/adnexae: No adnexal masses or free fluid. IMPRESSION: Irregular intrauterine gestational sac with questionable yolk sac and fetal pole. No fetal heart tones. Estimated gestational age approximately 6 weeks 1 day. This could be followed with repeat ultrasound in 10-14 days to  evaluate for progression. Small subchorionic hemorrhage. Electronically Signed   By: Charlett NoseKevin  Dover M.D.   On: 07/22/2017 11:48   [place] Ob Transvaginal  Result Date: 07/22/2017 CLINICAL DATA:  Bleeding EXAM: OBSTETRIC <14 WK US AND TRANSVAGINAL OB US TECHNIQUE: Both transabdominal and transvaginal ultrasound examinations were performed for complete evaluation of the gestation as well as the maternal uterus, adnexal regions, and pelvic cul-de-sac. Transvaginal technique was performed to assess early pregnancy. COMPARISON:  None. FINDINGS: Intrauterine gestational sac: Irregularly shaped single intrauterine gestational sac Yolk sac:  Questionable, not well visualized. Embryo:  Questionable,  not well visualized Cardiac Activity: Not visualized Heart Rate:   bpm MSD: 17.1 mm   6 w   4 d CRL:  4.3 mm   6 w   1 d                  [place] EDC: 03/16/2018 Subchorionic hemorrhage:  Small subchorionic hemorrhage Maternal uterus/adnexae: No adnexal masses or free fluid. IMPRESSION: Irregular intrauterine gestational sac with questionable yolk sac and fetal pole. No fetal heart tones. Estimated gestational age approximately 6 weeks 1 day. This could be followed with repeat ultrasound in 10-14 days to evaluate for progression. Small subchorionic hemorrhage. Electronically Signed   By: Charlett Nose M.D.   On: 07/22/2017 11:48     Assessment and Plan  Threatened miscarriage  - Information provided on threatened miscarriage - Advised to return back to MAU for increased bleeding and/or abd pain   Bleeding in early pregnancy - Information provided on [facility]  Discharge home Patient verbalized an understanding of the plan of care and agrees.    Raelyn Mora, MSN, CNM 07/22/2017, 10:36 AM

## 2017-07-22 NOTE — MAU Note (Signed)
Patient states she was seen at the clinic last week for pregnancy confirmation and had an US that showed a sac, but no heartbeat.  Pt was told to come to MAU if had any bleeding.  She had some light pink spotting today while at work so came in for evaluation.  Denies pain.  LMP approx 05/09/17.  Had miscarriage back in September/october 2018.

## 2017-07-23 LAB — GC/CHLAMYDIA PROBE AMP (~~LOC~~) NOT AT ARMC
CHLAMYDIA, DNA PROBE: NEGATIVE
Neisseria Gonorrhea: NEGATIVE

## 2017-07-23 LAB — HIV ANTIBODY (ROUTINE TESTING W REFLEX): HIV SCREEN 4TH GENERATION: NONREACTIVE

## 2019-07-19 ENCOUNTER — Emergency Department (HOSPITAL_COMMUNITY): Payer: 59

## 2019-07-19 ENCOUNTER — Observation Stay (HOSPITAL_COMMUNITY)
Admission: EM | Admit: 2019-07-19 | Discharge: 2019-07-20 | Disposition: A | Discharge status: Home / Self Care | Payer: 59 | Attending: Obstetrics and Gynecology | Admitting: Obstetrics and Gynecology

## 2019-07-19 ENCOUNTER — Other Ambulatory Visit: Payer: Self-pay

## 2019-07-19 ENCOUNTER — Encounter (HOSPITAL_COMMUNITY): Payer: Self-pay | Admitting: Emergency Medicine

## 2019-07-19 DIAGNOSIS — Z20822 Contact with and (suspected) exposure to covid-19: Secondary | ICD-10-CM | POA: Insufficient documentation

## 2019-07-19 DIAGNOSIS — R102 Pelvic and perineal pain: Principal | ICD-10-CM | POA: Insufficient documentation

## 2019-07-19 LAB — CBC
HCT: 34.7 % — ABNORMAL LOW (ref 36.0–46.0)
Hemoglobin: 10.1 g/dL — ABNORMAL LOW (ref 12.0–15.0)
MCH: 22.9 pg — ABNORMAL LOW (ref 26.0–34.0)
MCHC: 29.1 g/dL — ABNORMAL LOW (ref 30.0–36.0)
MCV: 78.7 fL — ABNORMAL LOW (ref 80.0–100.0)
Platelets: 327 10*3/uL (ref 150–400)
RBC: 4.41 MIL/uL (ref 3.87–5.11)
RDW: 15.8 % — ABNORMAL HIGH (ref 11.5–15.5)
WBC: 7.9 10*3/uL (ref 4.0–10.5)
nRBC: 0 % (ref 0.0–0.2)

## 2019-07-19 LAB — COMPREHENSIVE METABOLIC PANEL
ALT: 10 U/L (ref 0–44)
AST: 17 U/L (ref 15–41)
Albumin: 3.9 g/dL (ref 3.5–5.0)
Alkaline Phosphatase: 52 U/L (ref 38–126)
Anion gap: 5 (ref 5–15)
BUN: 14 mg/dL (ref 6–20)
CO2: 25 mmol/L (ref 22–32)
Calcium: 9.1 mg/dL (ref 8.9–10.3)
Chloride: 107 mmol/L (ref 98–111)
Creatinine, Ser: 0.66 mg/dL (ref 0.44–1.00)
GFR calc Af Amer: >60 mL/min (ref >60)
GFR calc non Af Amer: >60 mL/min (ref >60)
Glucose, Bld: 95 mg/dL (ref 70–99)
Potassium: 4 mmol/L (ref 3.5–5.1)
Sodium: 137 mmol/L (ref 135–145)
Total Bilirubin: 0.6 mg/dL (ref 0.3–1.2)
Total Protein: 7.1 g/dL (ref 6.5–8.1)

## 2019-07-19 LAB — URINALYSIS, ROUTINE W REFLEX MICROSCOPIC
Bilirubin Urine: NEGATIVE
Glucose, UA: NEGATIVE mg/dL
Hgb urine dipstick: NEGATIVE
Ketones, ur: 5 mg/dL — AB
Nitrite: NEGATIVE
Protein, ur: 30 mg/dL — AB
Specific Gravity, Urine: 1.029 (ref 1.005–1.030)
pH: 5 (ref 5.0–8.0)

## 2019-07-19 LAB — RESPIRATORY PANEL BY RT PCR (FLU A&B, COVID)
Influenza A by PCR: NEGATIVE
Influenza B by PCR: NEGATIVE
SARS Coronavirus 2 by RT PCR: NEGATIVE

## 2019-07-19 LAB — WET PREP, GENITAL
Clue Cells Wet Prep HPF POC: NONE SEEN
Trich, Wet Prep: NONE SEEN
Yeast Wet Prep HPF POC: NONE SEEN

## 2019-07-19 LAB — LIPASE, BLOOD: Lipase: 23 U/L (ref 11–51)

## 2019-07-19 LAB — I-STAT BETA HCG BLOOD, ED (MC, WL, AP ONLY): I-stat hCG, quantitative: <5 m[IU]/mL (ref <5)

## 2019-07-19 MED ORDER — KETOROLAC TROMETHAMINE 30 MG/ML IJ SOLN: 30.0000 mg | Freq: Four times a day (QID) | INTRAMUSCULAR | Status: DC

## 2019-07-19 MED ORDER — SODIUM CHLORIDE 0.9% FLUSH: 3.0000 mL | Freq: Once | INTRAVENOUS | Status: DC

## 2019-07-19 MED ORDER — MORPHINE SULFATE (PF) 2 MG/ML IV SOLN
2.0000 mg | Freq: Once | INTRAVENOUS | Status: AC
  Administered 2019-07-19: 2 mg via INTRAVENOUS
  Filled 2019-07-19: qty 1

## 2019-07-19 MED ORDER — ZOLPIDEM TARTRATE 5 MG PO TABS: 5.0000 mg | ORAL_TABLET | Freq: Every evening | ORAL | Status: DC | PRN

## 2019-07-19 MED ORDER — IOHEXOL 300 MG/ML  SOLN
100.0000 mL | Freq: Once | INTRAMUSCULAR | Status: AC | PRN
  Administered 2019-07-19: 100 mL via INTRAVENOUS

## 2019-07-19 MED ORDER — SIMETHICONE 80 MG PO CHEW
80.0000 mg | CHEWABLE_TABLET | Freq: Four times a day (QID) | ORAL | Status: DC | PRN
  Filled 2019-07-19: qty 1

## 2019-07-19 MED ORDER — MORPHINE SULFATE (PF) 4 MG/ML IV SOLN
4.0000 mg | Freq: Once | INTRAVENOUS | Status: AC
  Administered 2019-07-19: 4 mg via INTRAVENOUS
  Filled 2019-07-19: qty 1

## 2019-07-19 MED ORDER — ACETAMINOPHEN 325 MG PO TABS: 650.0000 mg | ORAL_TABLET | ORAL | Status: DC | PRN

## 2019-07-19 MED ORDER — LACTATED RINGERS IV SOLN: INTRAVENOUS | Status: DC

## 2019-07-19 MED ORDER — ONDANSETRON HCL 4 MG/2ML IJ SOLN
4.0000 mg | Freq: Once | INTRAMUSCULAR | Status: AC
  Administered 2019-07-19: 4 mg via INTRAVENOUS
  Filled 2019-07-19: qty 2

## 2019-07-19 MED ORDER — OXYCODONE-ACETAMINOPHEN 5-325 MG PO TABS: 1.0000 | ORAL_TABLET | ORAL | Status: DC | PRN

## 2019-07-19 MED ORDER — KETOROLAC TROMETHAMINE 30 MG/ML IJ SOLN
30.0000 mg | Freq: Four times a day (QID) | INTRAMUSCULAR | Status: DC
  Administered 2019-07-19 – 2019-07-20 (×2): 30 mg via INTRAVENOUS
  Filled 2019-07-19 (×3): qty 1

## 2019-07-19 MED ORDER — HYDROMORPHONE HCL 1 MG/ML IJ SOLN
1.0000 mg | INTRAMUSCULAR | Status: DC | PRN
  Administered 2019-07-19: 1 mg via INTRAVENOUS
  Filled 2019-07-19: qty 1
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
## 2019-07-19 NOTE — H&P (Signed)
Julie Strong is an 29 y.[id number] female with LMP 07/13/19. Presented to Er with sudden onset of left flank pain radiating to left pelvis this morning. ER work demonstrated normal Abd/pelvic CT scan and GYN U/S. GU cultures collected by ED. Sexual active without contraception. Last IC yesterday without problems.  Cycle in December lasted longer than usual, otherwise cycles regular and monthly.  Denies any bowel or bladder dysfunction, fever, chills or N/V.  Pertinent Gynecological History: Menses: as noted above Contraception: none DES exposure: denies Blood transfusions: none Sexually transmitted diseases: no past history Previous GYN Procedures: EAB  Last mammogram: NA  Last pap: normal Date: 2019 OB History: TSVD x 2, SAB x 2, EAB x 1  Menstrual History: Menarche age: 29 Patient's last menstrual period was 07/12/2019.    Past Medical History:  Diagnosis Date  . Anemia     Past Surgical History:  Procedure Laterality Date  . NO PAST SURGERIES      Family History  Problem Relation Age of Onset  . Diabetes Paternal Grandmother   . Alcohol abuse Neg Hx     Social History:  reports that she has never smoked. She has never used smokeless tobacco. She reports that she does not drink alcohol or use drugs.  Allergies: No Known Allergies  (Not in a hospital admission)   Review of Systems  Constitutional: Negative.   Respiratory: Negative.   Gastrointestinal: Positive for abdominal pain.  Genitourinary: Positive for flank pain and pelvic pain.    Blood pressure 102/76, pulse 83, temperature 98.3 F (36.8 C), temperature source Oral, resp. rate 16, last menstrual period 07/12/2019, SpO2 100 %, unknown if currently breastfeeding. Physical Exam  Constitutional: She appears well-developed and well-nourished.  Cardiovascular: Normal rate and regular rhythm.  Respiratory: Effort normal and breath sounds normal.  GI: Soft. Bowel sounds are normal.  Diffuse lower abd/pelvic  tenderness, no rebound, no guarding  Genitourinary:    Genitourinary Comments: Speculum as per PA in ED  Nl EGBUS Uterus small, mobile, min tenderness, no bladder tenderness, min left adnexal tenderness     Results for orders placed or performed during the hospital encounter of 07/19/19 (from the past 24 hour(s))  Lipase, blood     Status: None   Collection Time: 07/19/19  9:50 AM  Result Value Ref Range   Lipase 23 11 - 51 U/L  Comprehensive metabolic panel     Status: None   Collection Time: 07/19/19  9:50 AM  Result Value Ref Range   Sodium 137 135 - 145 mmol/L   Potassium 4.0 3.5 - 5.1 mmol/L   Chloride 107 98 - 111 mmol/L   CO2 25 22 - 32 mmol/L   Glucose, Bld 95 70 - 99 mg/dL   BUN 14 6 - 20 mg/dL   Creatinine, Ser 0.71 0.44 - 1.00 mg/dL   Calcium 9.1 8.9 - 21.9 mg/dL   Total Protein 7.1 6.5 - 8.1 g/dL   Albumin 3.9 3.5 - 5.0 g/dL   AST 17 15 - 41 U/L   ALT 10 0 - 44 U/L   Alkaline Phosphatase 52 38 - 126 U/L   Total Bilirubin 0.6 0.3 - 1.2 mg/dL   GFR calc non Af Amer >60 >60 mL/min   GFR calc Af Amer >60 >60 mL/min   Anion gap 5 5 - 15  CBC     Status: Abnormal   Collection Time: 07/19/19  9:50 AM  Result Value Ref Range   WBC 7.9 4.0 - 10.5  K/uL   RBC 4.41 3.87 - 5.11 MIL/uL   Hemoglobin 10.1 (L) 12.0 - 15.0 g/dL   HCT 28.4 (L) 13.2 - 44.0 %   MCV 78.7 (L) 80.0 - 100.0 fL   MCH 22.9 (L) 26.0 - 34.0 pg   MCHC 29.1 (L) 30.0 - 36.0 g/dL   RDW 10.2 (H) 72.5 - 36.6 %   Platelets 327 150 - 400 K/uL   nRBC 0.0 0.0 - 0.2 %  Urinalysis, Routine w reflex microscopic     Status: Abnormal   Collection Time: 07/19/19 10:02 AM  Result Value Ref Range   Color, Urine YELLOW YELLOW   APPearance CLOUDY (A) CLEAR   Specific Gravity, Urine 1.029 1.005 - 1.030   pH 5.0 5.0 - 8.0   Glucose, UA NEGATIVE NEGATIVE mg/dL   Hgb urine dipstick NEGATIVE NEGATIVE   Bilirubin Urine NEGATIVE NEGATIVE   Ketones, ur 5 (A) NEGATIVE mg/dL   Protein, ur 30 (A) NEGATIVE mg/dL   Nitrite  NEGATIVE NEGATIVE   Leukocytes,Ua MODERATE (A) NEGATIVE   RBC / HPF 0-5 0 - 5 RBC/hpf   WBC, UA 6-10 0 - 5 WBC/hpf   Bacteria, UA RARE (A) NONE SEEN   Squamous Epithelial / LPF 0-5 0 - 5   Mucus PRESENT   I-Stat beta hCG blood, ED     Status: None   Collection Time: 07/19/19 10:05 AM  Result Value Ref Range   I-stat hCG, quantitative <5.0 <5 mIU/mL   Comment 3          Respiratory Panel by RT PCR (Flu A&B, Covid) - Nasopharyngeal Swab     Status: None   Collection Time: 07/19/19 11:26 AM   Specimen: Nasopharyngeal Swab  Result Value Ref Range   SARS Coronavirus 2 by RT PCR NEGATIVE NEGATIVE   Influenza A by PCR NEGATIVE NEGATIVE   Influenza B by PCR NEGATIVE NEGATIVE  Wet prep, genital     Status: Abnormal   Collection Time: 07/19/19  1:17 PM   Specimen: PATH Cytology Cervicovaginal Ancillary Only  Result Value Ref Range   Yeast Wet Prep HPF POC NONE SEEN NONE SEEN   Trich, Wet Prep NONE SEEN NONE SEEN   Clue Cells Wet Prep HPF POC NONE SEEN NONE SEEN   WBC, Wet Prep HPF POC MANY (A) NONE SEEN   Sperm PRESENT     US Transvaginal Non-OB  Result Date: 07/19/2019 CLINICAL DATA:  Rule out torsion. Acute onset left lower quadrant pain. EXAM: TRANSABDOMINAL AND TRANSVAGINAL ULTRASOUND OF PELVIS DOPPLER ULTRASOUND OF OVARIES TECHNIQUE: Both transabdominal and transvaginal ultrasound examinations of the pelvis were performed. Transabdominal technique was performed for global imaging of the pelvis including uterus, ovaries, adnexal regions, and pelvic cul-de-sac. It was necessary to proceed with endovaginal exam following the transabdominal exam to visualize the endometrium and ovaries. Color and duplex Doppler ultrasound was utilized to evaluate blood flow to the ovaries. COMPARISON:  None. FINDINGS: Uterus Measurements: 7.5 x 4.2 x 4.7 cm = volume: 76.8 mL. No fibroids or other mass visualized. Endometrium Thickness: 7.2 mm.  No focal abnormality visualized. Right ovary Measurements: 4.3 x  3.1 x 3.2 cm = volume: 22.3 mL. Contains multiple small peripheral follicles. No suspicious masses. Left ovary Measurements: 4.3 x 2.6 x 3.3 cm = volume: 18.6 mL. Contains multiple small peripheral follicles. No suspicious masses. Pulsed Doppler evaluation of both ovaries demonstrates normal low-resistance arterial and venous waveforms. Other findings A small amount of physiologic fluid is seen in the pelvis.  IMPRESSION: 1. No torsion identified at the time of today's imaging. 2. Multiple small follicles in the periphery of both ovaries raises the possibility of PCOS. Recommend clinical correlation. Electronically Signed   By: Gerome Sam III M.D   On: 07/19/2019 12:36   US Pelvis Complete  Result Date: 07/19/2019 CLINICAL DATA:  Rule out torsion. Acute onset left lower quadrant pain. EXAM: TRANSABDOMINAL AND TRANSVAGINAL ULTRASOUND OF PELVIS DOPPLER ULTRASOUND OF OVARIES TECHNIQUE: Both transabdominal and transvaginal ultrasound examinations of the pelvis were performed. Transabdominal technique was performed for global imaging of the pelvis including uterus, ovaries, adnexal regions, and pelvic cul-de-sac. It was necessary to proceed with endovaginal exam following the transabdominal exam to visualize the endometrium and ovaries. Color and duplex Doppler ultrasound was utilized to evaluate blood flow to the ovaries. COMPARISON:  None. FINDINGS: Uterus Measurements: 7.5 x 4.2 x 4.7 cm = volume: 76.8 mL. No fibroids or other mass visualized. Endometrium Thickness: 7.2 mm.  No focal abnormality visualized. Right ovary Measurements: 4.3 x 3.1 x 3.2 cm = volume: 22.3 mL. Contains multiple small peripheral follicles. No suspicious masses. Left ovary Measurements: 4.3 x 2.6 x 3.3 cm = volume: 18.6 mL. Contains multiple small peripheral follicles. No suspicious masses. Pulsed Doppler evaluation of both ovaries demonstrates normal low-resistance arterial and venous waveforms. Other findings A small amount of  physiologic fluid is seen in the pelvis. IMPRESSION: 1. No torsion identified at the time of today's imaging. 2. Multiple small follicles in the periphery of both ovaries raises the possibility of PCOS. Recommend clinical correlation. Electronically Signed   By: Gerome Sam III M.D   On: 07/19/2019 12:36   CT ABDOMEN PELVIS W CONTRAST  Result Date: 07/19/2019 CLINICAL DATA:  Left flank pain. EXAM: CT ABDOMEN AND PELVIS WITH CONTRAST TECHNIQUE: Multidetector CT imaging of the abdomen and pelvis was performed using the standard protocol following bolus administration of intravenous contrast. CONTRAST:  OMNIPAQUE IOHEXOL 300 MG/ML  SOLN COMPARISON:  Pelvic ultrasound July 19, 2019 FINDINGS: Lower chest: No acute abnormality. Hepatobiliary: No focal liver abnormality is seen. No gallstones, gallbladder wall thickening, or biliary dilatation. Pancreas: Unremarkable. No pancreatic ductal dilatation or surrounding inflammatory changes. Spleen: Normal in size without focal abnormality. Adrenals/Urinary Tract: Adrenal glands are unremarkable. Kidneys are normal, without renal calculi, focal lesion, or hydronephrosis. Bladder is unremarkable. Stomach/Bowel: Stomach is within normal limits. Appendix appears normal. No evidence of bowel wall thickening, distention, or inflammatory changes. Vascular/Lymphatic: No significant vascular findings are present. No enlarged abdominal or pelvic lymph nodes. Reproductive: The uterus is normal. The ovaries are prominent with no suspicious masses. Other: There is a small amount of physiologic fluid in the pelvis. Musculoskeletal: No acute or significant osseous findings. IMPRESSION: 1. No diverticulitis.  No diverticulosis noted. 2. A small amount of physiologic fluid is seen in the pelvis. 3. No other acute abnormalities. Electronically Signed   By: Gerome Sam III M.D   On: 07/19/2019 14:32   [place] Art/Ven Flow Abd Pelv Doppler  Result Date: 07/19/2019 CLINICAL  DATA:  Rule out torsion. Acute onset left lower quadrant pain. EXAM: TRANSABDOMINAL AND TRANSVAGINAL ULTRASOUND OF PELVIS DOPPLER ULTRASOUND OF OVARIES TECHNIQUE: Both transabdominal and transvaginal ultrasound examinations of the pelvis were performed. Transabdominal technique was performed for global imaging of the pelvis including uterus, ovaries, adnexal regions, and pelvic cul-de-sac. It was necessary to proceed with endovaginal exam following the transabdominal exam to visualize the endometrium and ovaries. Color and duplex Doppler ultrasound was utilized to evaluate blood flow  to the ovaries. COMPARISON:  None. FINDINGS: Uterus Measurements: 7.5 x 4.2 x 4.7 cm = volume: 76.8 mL. No fibroids or other mass visualized. Endometrium Thickness: 7.2 mm.  No focal abnormality visualized. Right ovary Measurements: 4.3 x 3.1 x 3.2 cm = volume: 22.3 mL. Contains multiple small peripheral follicles. No suspicious masses. Left ovary Measurements: 4.3 x 2.6 x 3.3 cm = volume: 18.6 mL. Contains multiple small peripheral follicles. No suspicious masses. Pulsed Doppler evaluation of both ovaries demonstrates normal low-resistance arterial and venous waveforms. Other findings A small amount of physiologic fluid is seen in the pelvis. IMPRESSION: 1. No torsion identified at the time of today's imaging. 2. Multiple small follicles in the periphery of both ovaries raises the possibility of PCOS. Recommend clinical correlation. Electronically Signed   By: Dorise Bullion III M.D   On: 07/19/2019 12:36    Assessment/Plan: Abd/pelvic pain of unclear etiology  No clear source of pt's pt. Low suspension for ovarian torsion and PID. UPT negative thus no ectopic. No evidence of kidney stone by studies as well. Will admit for observation and pain management. Discussed with pt. Agrees with POC  Chancy Milroy 07/19/2019, 5:25 PM

## 2019-07-19 NOTE — ED Notes (Signed)
Patient transported to CT 

## 2019-07-19 NOTE — ED Notes (Signed)
Pt requesting pain meds. - RN aware ?

## 2019-07-19 NOTE — ED Triage Notes (Signed)
Pt to triage via [facility] and [facility].  Pt reports L flank pain this morning that radiates to L pelvic.  Denies urinary symptoms.  Pt drove to [facility] and EMS was called.  On their arrival pt diaphoretic with 10/10 pain.  Fentanyl 50 mcg given and pain now 2/10.

## 2019-07-19 NOTE — ED Notes (Signed)
Pts husband brought food for pt. Pt made aware that DR ordered a clear fluid diet for her. Pt made aware of potential risks with her eating food. RN made aware.

## 2019-07-19 NOTE — ED Notes (Signed)
Report given to lauren

## 2019-07-20 ENCOUNTER — Encounter (HOSPITAL_COMMUNITY): Payer: Self-pay | Admitting: Obstetrics and Gynecology

## 2019-07-20 DIAGNOSIS — R102 Pelvic and perineal pain: Secondary | ICD-10-CM | POA: Diagnosis not present

## 2019-07-20 LAB — CBC WITH DIFFERENTIAL/PLATELET
Abs Immature Granulocytes: 0.01 10*3/uL (ref 0.00–0.07)
Basophils Absolute: 0 10*3/uL (ref 0.0–0.1)
Basophils Relative: 0 %
Eosinophils Absolute: 0.2 10*3/uL (ref 0.0–0.5)
Eosinophils Relative: 2 %
HCT: 32.7 % — ABNORMAL LOW (ref 36.0–46.0)
Hemoglobin: 9.9 g/dL — ABNORMAL LOW (ref 12.0–15.0)
Immature Granulocytes: 0 %
Lymphocytes Relative: 32 %
Lymphs Abs: 2.7 10*3/uL (ref 0.7–4.0)
MCH: 23.3 pg — ABNORMAL LOW (ref 26.0–34.0)
MCHC: 30.3 g/dL (ref 30.0–36.0)
MCV: 76.9 fL — ABNORMAL LOW (ref 80.0–100.0)
Monocytes Absolute: 0.7 10*3/uL (ref 0.1–1.0)
Monocytes Relative: 9 %
Neutro Abs: 4.6 10*3/uL (ref 1.7–7.7)
Neutrophils Relative %: 57 %
Platelets: 295 10*3/uL (ref 150–400)
RBC: 4.25 MIL/uL (ref 3.87–5.11)
RDW: 15.8 % — ABNORMAL HIGH (ref 11.5–15.5)
WBC: 8.2 10*3/uL (ref 4.0–10.5)
nRBC: 0 % (ref 0.0–0.2)

## 2019-07-20 LAB — COMPREHENSIVE METABOLIC PANEL
ALT: 10 U/L (ref 0–44)
AST: 16 U/L (ref 15–41)
Albumin: 3.3 g/dL — ABNORMAL LOW (ref 3.5–5.0)
Alkaline Phosphatase: 49 U/L (ref 38–126)
Anion gap: 5 (ref 5–15)
BUN: 11 mg/dL (ref 6–20)
CO2: 28 mmol/L (ref 22–32)
Calcium: 8.8 mg/dL — ABNORMAL LOW (ref 8.9–10.3)
Chloride: 105 mmol/L (ref 98–111)
Creatinine, Ser: 0.76 mg/dL (ref 0.44–1.00)
GFR calc Af Amer: >60 mL/min (ref >60)
GFR calc non Af Amer: >60 mL/min (ref >60)
Glucose, Bld: 103 mg/dL — ABNORMAL HIGH (ref 70–99)
Potassium: 3.6 mmol/L (ref 3.5–5.1)
Sodium: 138 mmol/L (ref 135–145)
Total Bilirubin: 0.8 mg/dL (ref 0.3–1.2)
Total Protein: 6 g/dL — ABNORMAL LOW (ref 6.5–8.1)

## 2019-07-20 LAB — HIV ANTIBODY (ROUTINE TESTING W REFLEX): HIV Screen 4th Generation wRfx: NONREACTIVE

## 2019-07-20 MED ORDER — CEFTRIAXONE SODIUM 500 MG IJ SOLR
250.0000 mg | INTRAMUSCULAR | Status: DC
  Filled 2019-07-20: qty 500

## 2019-07-20 MED ORDER — DOXYCYCLINE HYCLATE 100 MG PO TABS: 100.0000 mg | ORAL_TABLET | Freq: Two times a day (BID) | ORAL | 0 refills | Status: AC

## 2019-07-20 MED ORDER — DOXYCYCLINE HYCLATE 100 MG PO TABS
100.0000 mg | ORAL_TABLET | Freq: Two times a day (BID) | ORAL | Status: DC
  Administered 2019-07-20: 100 mg via ORAL
  Filled 2019-07-20: qty 1

## 2019-07-20 MED ORDER — LIDOCAINE HCL (PF) 1 % IJ SOLN
INTRAMUSCULAR | Status: AC
  Administered 2019-07-20: 11:00:00 0.9 mL
  Filled 2019-07-20: qty 30

## 2019-07-20 MED ORDER — METRONIDAZOLE 500 MG PO TABS
500.0000 mg | ORAL_TABLET | Freq: Two times a day (BID) | ORAL | Status: DC
  Administered 2019-07-20: 500 mg via ORAL
  Filled 2019-07-20: qty 1

## 2019-07-20 MED ORDER — ACETAMINOPHEN 325 MG PO TABS: 650.0000 mg | ORAL_TABLET | Freq: Four times a day (QID) | ORAL | 0 refills | Status: AC | PRN

## 2019-07-20 MED ORDER — KETOROLAC TROMETHAMINE 10 MG PO TABS: 10.0000 mg | ORAL_TABLET | Freq: Four times a day (QID) | ORAL | 0 refills | Status: AC | PRN

## 2019-07-20 MED ORDER — METRONIDAZOLE 500 MG PO TABS: 500.0000 mg | ORAL_TABLET | Freq: Two times a day (BID) | ORAL | 0 refills | Status: AC

## 2019-07-20 MED ORDER — OXYCODONE-ACETAMINOPHEN 5-325 MG PO TABS: 1.0000 | ORAL_TABLET | ORAL | 0 refills | Status: AC | PRN

## 2019-07-20 MED ORDER — CEFTRIAXONE SODIUM 250 MG IJ SOLR
250.0000 mg | Freq: Once | INTRAMUSCULAR | Status: AC
  Administered 2019-07-20: 250 mg via INTRAMUSCULAR
  Filled 2019-07-20: qty 250

## 2019-07-20 NOTE — Progress Notes (Signed)
Gynecology Progress Note  Admission Date: 07/19/2019 Current Date: 07/20/2019 8:48 AM  Julie Strong is a 29 y.o. [id number] HD#2 admitted for pelvic pain   History complicated by: Patient Active Problem List   Diagnosis Date Noted   Pelvic pain 07/19/2019    ROS and patient/family/surgical history, located on admission H&P note dated 07/19/2019, have been reviewed, and there are no changes except as noted below Yesterday/Overnight Events:  none  Subjective:  Pt slept overnight. Pain is better this morning.   Objective:     Current Vital Signs 24h Vital Sign Ranges  T 98.7 F (37.1 C) Temp  Avg: 98.5 F (36.9 C)  Min: 98.2 F (36.8 C)  Max: 98.7 F (37.1 C)  BP 103/71 BP  Min: 101/64  Max: 122/78  HR 60 Pulse  Avg: 71.2  Min: 30  Max: 88  RR 16 Resp  Avg: 16.8  Min: 16  Max: 18  SaO2 100 % Room Air SpO2  Avg: 98.9 %  Min: 83 %  Max: 100 %       24 Hour I/O Current Shift I/O  Time Ins Outs 01/10 0701 - 01/11 0700 In: 53 [I.V.:53] Out: -  No intake/output data recorded.   Physical exam: General appearance: alert, cooperative, and appears stated age Abdomen: soft, non-tender; bowel sounds normal; no masses,  no organomegaly Lungs: clear to auscultation bilaterally Heart: S1, S2 normal, no murmur, rub or gallop, regular rate and rhythm Extremities: no c/c/e Skin: warm and dry Psych: appropriate Neurologic: Grossly normal  Medications Current Facility-Administered Medications  Medication Dose Route Frequency Provider Last Rate Last Admin   acetaminophen (TYLENOL) tablet 650 mg  650 mg Oral Q4H PRN Chancy Milroy, MD       HYDROmorphone (DILAUDID) injection 1 mg  1 mg Intravenous Q3H PRN Chancy Milroy, MD   1 mg at 07/19/19 2242   ketorolac (TORADOL) 30 MG/ML injection 30 mg  30 mg Intravenous Q6H Chancy Milroy, MD   30 mg at 07/20/19 0534   Or   ketorolac (TORADOL) 30 MG/ML injection 30 mg  30 mg Intramuscular Q6H Chancy Milroy, MD       lactated ringers  infusion   Intravenous Continuous Chancy Milroy, MD 75 mL/hr at 07/19/19 2222 Restarted at 07/19/19 2222   oxyCODONE-acetaminophen (PERCOCET/ROXICET) 5-325 MG per tablet 1 tablet  1 tablet Oral Q4H PRN Chancy Milroy, MD       simethicone [facility]) chewable tablet 80 mg  80 mg Oral QID PRN Chancy Milroy, MD       sodium chloride flush (NS) 0.9 % injection 3 mL  3 mL Intravenous Once Tegeler, Gwenyth Allegra, MD       zolpidem (AMBIEN) tablet 5 mg  5 mg Oral QHS PRN Chancy Milroy, MD          Labs  Recent Labs  Lab 07/19/19 0950 07/20/19 0347  WBC 7.9 8.2  HGB 10.1* 9.9*  HCT 34.7* 32.7*  PLT 327 295    Recent Labs  Lab 07/19/19 0950 07/20/19 0347  NA 137 138  K 4.0 3.6  CL 107 105  CO2 25 28  BUN 14 11  CREATININE 0.66 0.76  CALCIUM 9.1 8.8*  PROT 7.1 6.0*  BILITOT 0.6 0.8  ALKPHOS 52 49  ALT 10 10  AST 17 16  GLUCOSE 95 103*    Radiology No new imaging  Assessment & Plan:  Pt improved *GYN: will get ucx and  presumptively tx for pid with rocephin, doxy and flagyl. Will get hiv, rpr *Pain: no issues *FEN/GI: sliv, reg diet.  *PPx: scds, oob ad lib *Dispo: later today. Message sent to clinic for f/u later this week  Code Status: Full Code  Total time taking care of the patient was 15 minutes, with greater than 50% of the time spent in face to face interaction with the patient.  Cornelia Copa MD Attending Center for [facility])

## 2019-07-20 NOTE — Discharge Summary (Signed)
Discharge Summary   Admit Date: 07/19/2019 Discharge Date: 07/20/2019 Discharging Service: Gynecology  Primary OBGYN: None Admitting Physician: Arlina Robes MD Discharge Physician: Ilda Basset  Referring Provider: [facility]  Primary Care Provider: Patient, No Pcp Per  Admission Diagnoses: *Pelvic Pain   Discharge Diagnoses: *Improved pelvic pain *?UTI *?PID  Consult Orders: IP CONSULT TO OB GYN   Surgeries/Procedures Performed: None  History and Physical:  Julie Strong is an 29 [id number] female with LMP 07/13/19. Presented to Er with sudden onset of left flank pain radiating to left pelvis this morning. ER work demonstrated normal Abd/pelvic CT scan and GYN U/S. GU cultures collected by ED.  Sexual active without contraception. Last IC yesterday without problems.   Cycle in December lasted longer than usual, otherwise cycles regular and monthly.     Denies any bowel or bladder dysfunction, fever, chills or N/V.     Pertinent Gynecological History:  Menses: as noted above  Contraception: none  DES exposure: denies  Blood transfusions: none  Sexually transmitted diseases: no past history  Previous GYN Procedures: EAB   Last mammogram: NA   Last pap: normal Date: 2019  OB History: TSVD x 2, SAB x 2, EAB x 1     Menstrual History:  Menarche age: 29  Patient's last menstrual period was 07/12/2019.            Past Medical History:    Diagnosis   Date    .   Anemia                    Past Surgical History:    Procedure   Laterality   Date    .   NO PAST SURGERIES                        Family History    Problem   Relation   Age of Onset    .   Diabetes   Paternal Grandmother        .   Alcohol abuse   Neg Hx              Social History:  reports that she has never smoked. She has never used smokeless tobacco. She reports that she does not drink alcohol or use drugs.      Allergies: No Known Allergies     (Not in a hospital admission)        Review of Systems   Constitutional: Negative.    Respiratory: Negative.    Gastrointestinal: Positive for abdominal pain.   Genitourinary: Positive for flank pain and pelvic pain.         Blood pressure 102/76, pulse 83, temperature 98.3 F (36.8 C), temperature source Oral, resp. rate 16, last menstrual period 07/12/2019, SpO2 100 %, unknown if currently breastfeeding.  Physical Exam   Constitutional: She appears well-developed and well-nourished.   Cardiovascular: Normal rate and regular rhythm.   Respiratory: Effort normal and breath sounds normal.   GI: Soft. Bowel sounds are normal.  Diffuse lower abd/pelvic tenderness, no rebound, no guarding  Genitourinary:    Genitourinary Comments: Speculum as per PA in ED  Nl EGBUS Uterus small, mobile, min tenderness, no bladder tenderness, min left adnexal tenderness          Lab Results Last 24 Hours          Results for orders placed or performed during the hospital encounter of 07/19/19 (from the past 24  hour(s))    Lipase, blood     Status: None        Collection Time: 07/19/19  9:50 AM    Result   Value   Ref Range        Lipase   23   11 - 51 U/L    Comprehensive metabolic panel     Status: None        Collection Time: 07/19/19  9:50 AM    Result   Value   Ref Range        Sodium   137   135 - 145 mmol/L        Potassium   4.0   3.5 - 5.1 mmol/L        Chloride   107   98 - 111 mmol/L        CO2   25   22 - 32 mmol/L        Glucose, Bld   95   70 - 99 mg/dL        BUN   14   6 - 20 mg/dL        Creatinine, Ser   0.66   0.44 - 1.00 mg/dL        Calcium   9.1   8.9 - 10.3 mg/dL        Total Protein   7.1   6.5 - 8.1 g/dL        Albumin   3.9   3.5 - 5.0 g/dL        AST   17   15 - 41 U/L        ALT    10   0 - 44 U/L        Alkaline Phosphatase   52   38 - 126 U/L        Total Bilirubin   0.6   0.3 - 1.2 mg/dL        GFR calc non Af Amer   >60   >60 mL/min        GFR calc Af Amer   >60   >60 mL/min        Anion gap   5   5 - 15    CBC     Status: Abnormal        Collection Time: 07/19/19  9:50 AM    Result   Value   Ref Range        WBC   7.9   4.0 - 10.5 K/uL        RBC   4.41   3.87 - 5.11 MIL/uL        Hemoglobin   10.1 (L)   12.0 - 15.0 g/dL        HCT   27.0 (L)   36.0 - 46.0 %        MCV   78.7 (L)   80.0 - 100.0 fL        MCH   22.9 (L)   26.0 - 34.0 pg        MCHC   29.1 (L)   30.0 - 36.0 g/dL        RDW   62.3 (H)   11.5 - 15.5 %        Platelets   327   150 - 400 K/uL        nRBC   0.0   0.0 - 0.2 %  Urinalysis, Routine w reflex microscopic     Status: Abnormal        Collection Time: 07/19/19 10:02 AM    Result   Value   Ref Range        Color, Urine   YELLOW   YELLOW        APPearance   CLOUDY (A)   CLEAR        Specific Gravity, Urine   1.029   1.005 - 1.030        pH   5.0   5.0 - 8.0        Glucose, UA   NEGATIVE   NEGATIVE mg/dL        Hgb urine dipstick   NEGATIVE   NEGATIVE        Bilirubin Urine   NEGATIVE   NEGATIVE        Ketones, ur   5 (A)   NEGATIVE mg/dL        Protein, ur   30 (A)   NEGATIVE mg/dL        Nitrite   NEGATIVE   NEGATIVE        Leukocytes,Ua   MODERATE (A)   NEGATIVE        RBC / HPF   0-5   0 - 5 RBC/hpf        WBC, UA   6-10   0 - 5 WBC/hpf        Bacteria, UA   RARE (A)   NONE SEEN        Squamous Epithelial / LPF   0-5   0 - 5        Mucus   PRESENT        I-Stat beta hCG blood, ED     Status: None        Collection Time: 07/19/19 10:05 AM    Result    Value   Ref Range        I-stat hCG, quantitative   <5.0   <5 mIU/mL        Comment 3                 Respiratory Panel by RT PCR (Flu A&B, Covid) - Nasopharyngeal Swab     Status: None        Collection Time: 07/19/19 11:26 AM        Specimen: Nasopharyngeal Swab    Result   Value   Ref Range        SARS Coronavirus 2 by RT PCR   NEGATIVE   NEGATIVE        Influenza A by PCR   NEGATIVE   NEGATIVE        Influenza B by PCR   NEGATIVE   NEGATIVE    Wet prep, genital     Status: Abnormal        Collection Time: 07/19/19  1:17 PM        Specimen: PATH Cytology Cervicovaginal Ancillary Only    Result   Value   Ref Range        Yeast Wet Prep HPF POC   NONE SEEN   NONE SEEN        Trich, Wet Prep   NONE SEEN   NONE SEEN        Clue Cells Wet Prep HPF POC   NONE SEEN   NONE SEEN        WBC, Wet Prep HPF POC  MANY (A)   NONE SEEN        Sperm   PRESENT                  Imaging Results (Last 48 hours)    US Transvaginal Non-OB     Result Date: 07/19/2019  CLINICAL DATA:  Rule out torsion. Acute onset left lower quadrant pain. EXAM: TRANSABDOMINAL AND TRANSVAGINAL ULTRASOUND OF PELVIS DOPPLER ULTRASOUND OF OVARIES TECHNIQUE: Both transabdominal and transvaginal ultrasound examinations of the pelvis were performed. Transabdominal technique was performed for global imaging of the pelvis including uterus, ovaries, adnexal regions, and pelvic cul-de-sac. It was necessary to proceed with endovaginal exam following the transabdominal exam to visualize the endometrium and ovaries. Color and duplex Doppler ultrasound was utilized to evaluate blood flow to the ovaries. COMPARISON:  None. FINDINGS: Uterus Measurements: 7.5 x 4.2 x 4.7 cm = volume: 76.8 mL. No fibroids or other mass visualized. Endometrium Thickness: 7.2 mm.  No focal abnormality visualized. Right ovary Measurements:  4.3 x 3.1 x 3.2 cm = volume: 22.3 mL. Contains multiple small peripheral follicles. No suspicious masses. Left ovary Measurements: 4.3 x 2.6 x 3.3 cm = volume: 18.6 mL. Contains multiple small peripheral follicles. No suspicious masses. Pulsed Doppler evaluation of both ovaries demonstrates normal low-resistance arterial and venous waveforms. Other findings A small amount of physiologic fluid is seen in the pelvis. IMPRESSION: 1. No torsion identified at the time of today's imaging. 2. Multiple small follicles in the periphery of both ovaries raises the possibility of PCOS. Recommend clinical correlation. Electronically Signed   By: Gerome Sam III M.D   On: 07/19/2019 12:36      US Pelvis Complete     Result Date: 07/19/2019  CLINICAL DATA:  Rule out torsion. Acute onset left lower quadrant pain. EXAM: TRANSABDOMINAL AND TRANSVAGINAL ULTRASOUND OF PELVIS DOPPLER ULTRASOUND OF OVARIES TECHNIQUE: Both transabdominal and transvaginal ultrasound examinations of the pelvis were performed. Transabdominal technique was performed for global imaging of the pelvis including uterus, ovaries, adnexal regions, and pelvic cul-de-sac. It was necessary to proceed with endovaginal exam following the transabdominal exam to visualize the endometrium and ovaries. Color and duplex Doppler ultrasound was utilized to evaluate blood flow to the ovaries. COMPARISON:  None. FINDINGS: Uterus Measurements: 7.5 x 4.2 x 4.7 cm = volume: 76.8 mL. No fibroids or other mass visualized. Endometrium Thickness: 7.2 mm.  No focal abnormality visualized. Right ovary Measurements: 4.3 x 3.1 x 3.2 cm = volume: 22.3 mL. Contains multiple small peripheral follicles. No suspicious masses. Left ovary Measurements: 4.3 x 2.6 x 3.3 cm = volume: 18.6 mL. Contains multiple small peripheral follicles. No suspicious masses. Pulsed Doppler evaluation of both ovaries demonstrates normal low-resistance arterial and venous waveforms. Other findings A  small amount of physiologic fluid is seen in the pelvis. IMPRESSION: 1. No torsion identified at the time of today's imaging. 2. Multiple small follicles in the periphery of both ovaries raises the possibility of PCOS. Recommend clinical correlation. Electronically Signed   By: Gerome Sam III M.D   On: 07/19/2019 12:36      CT ABDOMEN PELVIS W CONTRAST     Result Date: 07/19/2019  CLINICAL DATA:  Left flank pain. EXAM: CT ABDOMEN AND PELVIS WITH CONTRAST TECHNIQUE: Multidetector CT imaging of the abdomen and pelvis was performed using the standard protocol following bolus administration of intravenous contrast. CONTRAST:  OMNIPAQUE IOHEXOL 300 MG/ML  SOLN COMPARISON:  Pelvic ultrasound July 19, 2019 FINDINGS: Lower chest: No acute  abnormality. Hepatobiliary: No focal liver abnormality is seen. No gallstones, gallbladder wall thickening, or biliary dilatation. Pancreas: Unremarkable. No pancreatic ductal dilatation or surrounding inflammatory changes. Spleen: Normal in size without focal abnormality. Adrenals/Urinary Tract: Adrenal glands are unremarkable. Kidneys are normal, without renal calculi, focal lesion, or hydronephrosis. Bladder is unremarkable. Stomach/Bowel: Stomach is within normal limits. Appendix appears normal. No evidence of bowel wall thickening, distention, or inflammatory changes. Vascular/Lymphatic: No significant vascular findings are present. No enlarged abdominal or pelvic lymph nodes. Reproductive: The uterus is normal. The ovaries are prominent with no suspicious masses. Other: There is a small amount of physiologic fluid in the pelvis. Musculoskeletal: No acute or significant osseous findings. IMPRESSION: 1. No diverticulitis.  No diverticulosis noted. 2. A small amount of physiologic fluid is seen in the pelvis. 3. No other acute abnormalities. Electronically Signed   By: Gerome Samavid  Williams III M.D   On: 07/19/2019 14:32      US Art/Ven Flow Abd Pelv Doppler      Result Date: 07/19/2019  CLINICAL DATA:  Rule out torsion. Acute onset left lower quadrant pain. EXAM: TRANSABDOMINAL AND TRANSVAGINAL ULTRASOUND OF PELVIS DOPPLER ULTRASOUND OF OVARIES TECHNIQUE: Both transabdominal and transvaginal ultrasound examinations of the pelvis were performed. Transabdominal technique was performed for global imaging of the pelvis including uterus, ovaries, adnexal regions, and pelvic cul-de-sac. It was necessary to proceed with endovaginal exam following the transabdominal exam to visualize the endometrium and ovaries. Color and duplex Doppler ultrasound was utilized to evaluate blood flow to the ovaries. COMPARISON:  None. FINDINGS: Uterus Measurements: 7.5 x 4.2 x 4.7 cm = volume: 76.8 mL. No fibroids or other mass visualized. Endometrium Thickness: 7.2 mm.  No focal abnormality visualized. Right ovary Measurements: 4.3 x 3.1 x 3.2 cm = volume: 22.3 mL. Contains multiple small peripheral follicles. No suspicious masses. Left ovary Measurements: 4.3 x 2.6 x 3.3 cm = volume: 18.6 mL. Contains multiple small peripheral follicles. No suspicious masses. Pulsed Doppler evaluation of both ovaries demonstrates normal low-resistance arterial and venous waveforms. Other findings A small amount of physiologic fluid is seen in the pelvis. IMPRESSION: 1. No torsion identified at the time of today's imaging. 2. Multiple small follicles in the periphery of both ovaries raises the possibility of PCOS. Recommend clinical correlation. Electronically Signed   By: Gerome Samavid  Williams III M.D   On: 07/19/2019 12:36           Assessment/Plan:  Abd/pelvic pain of unclear etiology     No clear source of pt's pt. Low suspension for ovarian torsion and PID. UPT negative thus no ectopic. No evidence of kidney stone by studies as well.  Will admit for observation and pain management.  Discussed with pt. Agrees with POC     Hermina StaggersMichael L Ervin  07/19/2019, 5:25 PM          Electronically  signed by Hermina StaggersErvin, Michael L, MD at 07/19/2019  5:46 PM  Hospital Course: Patient admitted overnight and given serial toradol and did well. She was amenable for presumptive treatment for PID with rocephin and doxycycline and flagyl.   Discharge Exam:   Current Vital Signs 24h Vital Sign Ranges  T 98.7 F (37.1 C) Temp  Avg: 98.5 F (36.9 C)  Min: 98.2 F (36.8 C)  Max: 98.7 F (37.1 C)  BP 103/71 BP  Min: 101/64  Max: 122/78  HR 60 Pulse  Avg: 71.2  Min: 30  Max: 88  RR 16 Resp  Avg: 16.8  Min: 16  Max: 18  SaO2 100 % Room Air SpO2  Avg: 98.9 %  Min: 83 %  Max: 100 %       24 Hour I/O Current Shift I/O  Time Ins Outs 01/10 0701 - 01/11 0700 In: 53 [I.V.:53] Out: -  No intake/output data recorded.   General appearance: alert, cooperative, and appears stated age Abdomen: soft, non-tender; bowel sounds normal; no masses,  no organomegaly Lungs: clear to auscultation bilaterally Heart: S1, S2 normal, no murmur, rub or gallop, regular rate and rhythm Extremities: no c/c/e Skin: warm and dry Psych: appropriate Neurologic: Grossly normal  Discharge Disposition:  Home  Patient Instructions:  Standard   Results Pending at Discharge:  UCx, GC/CT  Discharge Medications: Allergies as of 07/20/2019   No Known Allergies     Medication List    STOP taking these medications   naproxen sodium 220 MG tablet Commonly known as: ALEVE     TAKE these medications   acetaminophen 325 MG tablet Commonly known as: TYLENOL Take 2 tablets (650 mg total) by mouth every 6 (six) hours as needed for mild pain, moderate pain or fever.   doxycycline 100 MG tablet Commonly known as: VIBRA-TABS Take 1 tablet (100 mg total) by mouth 2 (two) times daily for 7 days.   ELDERBERRY PO Take 1 Dose by mouth daily.   ketorolac 10 MG tablet Commonly known as: TORADOL Take 1 tablet (10 mg total) by mouth every 6 (six) hours as needed.   metroNIDAZOLE 500 MG tablet Commonly known as: FLAGYL Take  1 tablet (500 mg total) by mouth 2 (two) times daily for 7 days.   oxyCODONE-acetaminophen 5-325 MG tablet Commonly known as: PERCOCET/ROXICET Take 1 tablet by mouth every 4 (four) hours as needed (moderate to severe pain (when tolerating fluids)).        No future appointments.  Request made for f/u in one week  Cornelia Copaharlie Ramesses Crampton, Jr. MD Attending [facility](Faculty Practice)

## 2019-07-21 LAB — RPR: RPR Ser Ql: NONREACTIVE

## 2019-07-21 LAB — GC/CHLAMYDIA PROBE AMP (~~LOC~~) NOT AT ARMC
Chlamydia: NEGATIVE
Neisseria Gonorrhea: NEGATIVE

## 2019-07-22 ENCOUNTER — Other Ambulatory Visit: Payer: Self-pay | Admitting: Obstetrics and Gynecology

## 2019-07-22 LAB — URINE CULTURE: Culture: >=100000 — AB

## 2019-07-22 MED ORDER — SULFAMETHOXAZOLE-TRIMETHOPRIM 800-160 MG PO TABS: 1.0000 | ORAL_TABLET | Freq: Two times a day (BID) | ORAL | 0 refills | Status: AC

## 2019-07-22 NOTE — Progress Notes (Signed)
GYN Telephone Note Patient called at [phone] and generic VM picked. VM left to stop abx she went home on and to start the abx that was called into the [facility] pharmacy.   Cornelia Copa MD Attending Center for [employer] (Faculty Practice) 07/22/2019 Time: [phone]

## 2019-07-31 ENCOUNTER — Ambulatory Visit: Payer: 59 | Admitting: Obstetrics and Gynecology

## 2019-07-31 ENCOUNTER — Encounter: Payer: Self-pay | Admitting: Obstetrics and Gynecology

## 2019-08-03 NOTE — Progress Notes (Signed)
Patient did not keep her GYN appointment for 07/31/2019.  Zyairah Wacha, Jr MD Attending [facility] (Faculty Practice)   

## 2020-11-06 IMAGING — US US PELVIS COMPLETE
1 series · 13 of 25 positions shown · non-contrast
Comparison: None.

CLINICAL DATA: Rule out torsion. Acute onset left lower quadrant
pain.

EXAM:
TRANSABDOMINAL AND TRANSVAGINAL ULTRASOUND OF PELVIS
DOPPLER ULTRASOUND OF OVARIES
TECHNIQUE: Both transabdominal and transvaginal ultrasound examinations of the
pelvis were performed. Transabdominal technique was performed for
global imaging of the pelvis including uterus, ovaries, adnexal
regions, and pelvic cul-de-sac.
It was necessary to proceed with endovaginal exam following the
transabdominal exam to visualize the endometrium and ovaries. Color
and duplex Doppler ultrasound was utilized to evaluate blood flow to
the ovaries.

[Series 1: us pelvis complete · 123 acquisitions, 13 frames shown]
[im 1/123]
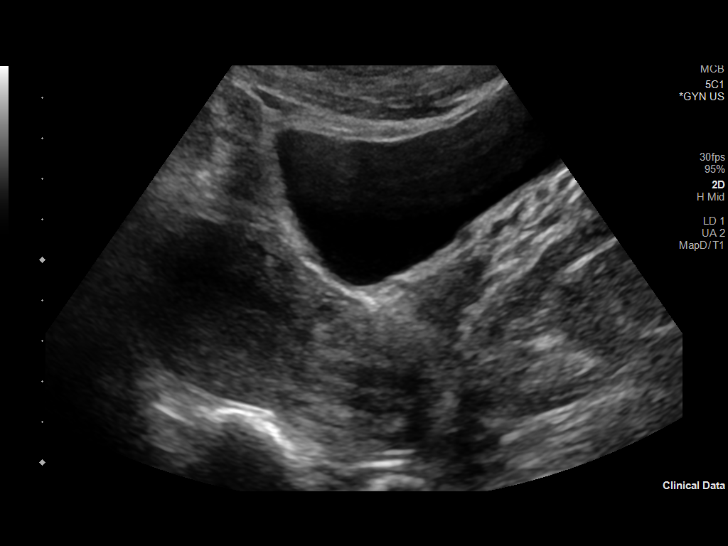
[im 11/123]
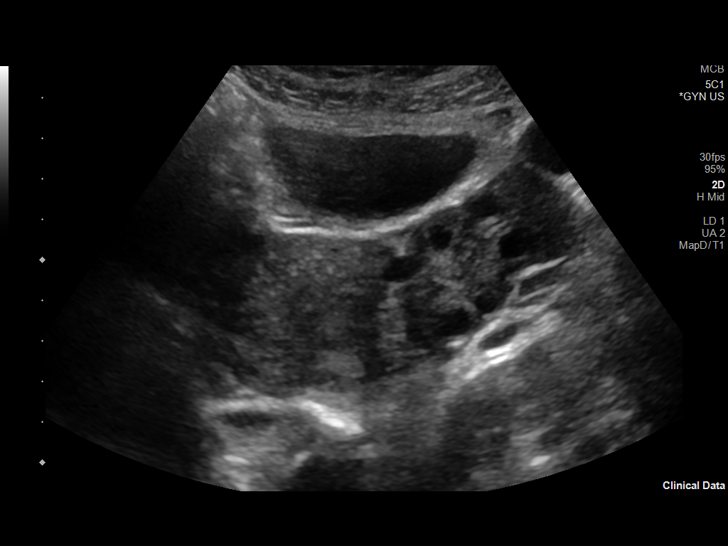
[im 21/123]
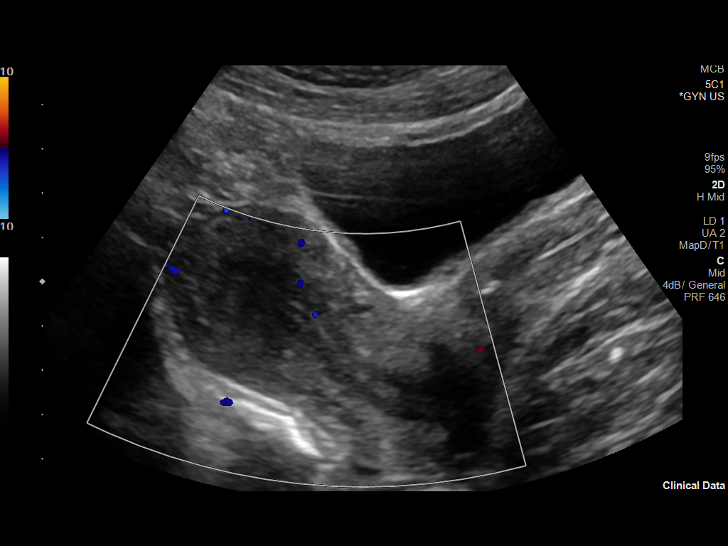
[im 31/123]
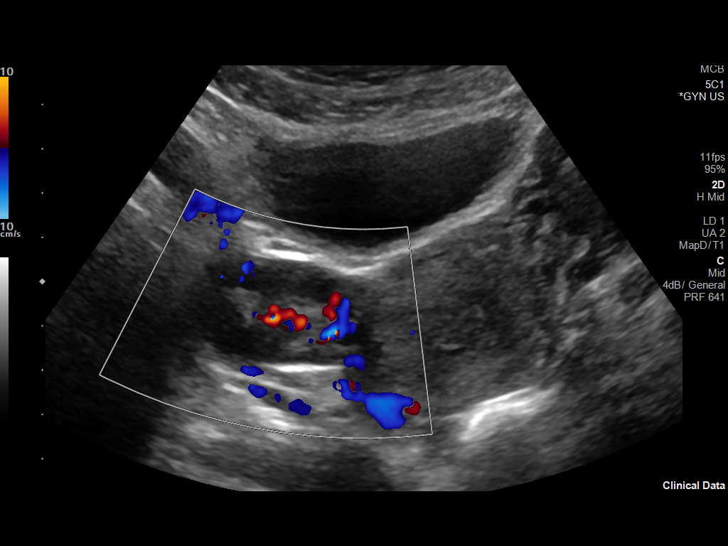
[im 41/123]
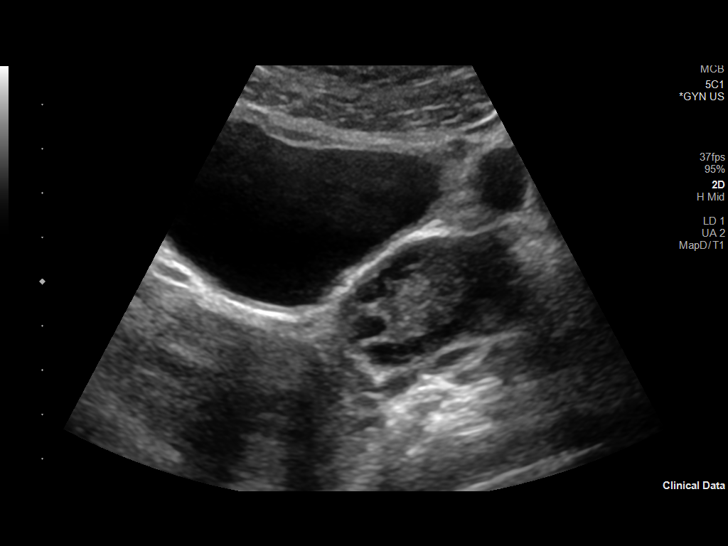
[im 51/123]
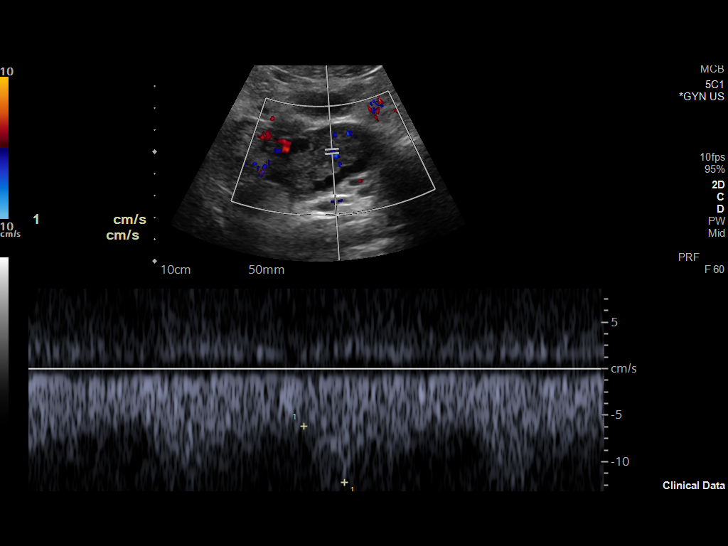
[im 62/123]
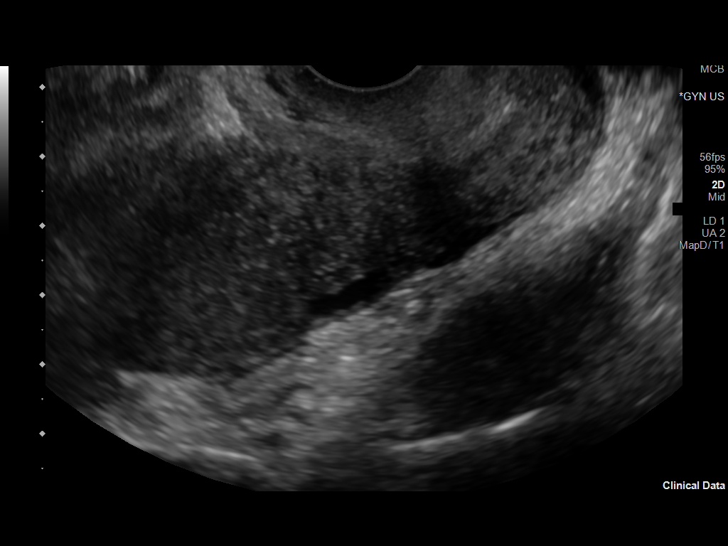
[im 72/123]
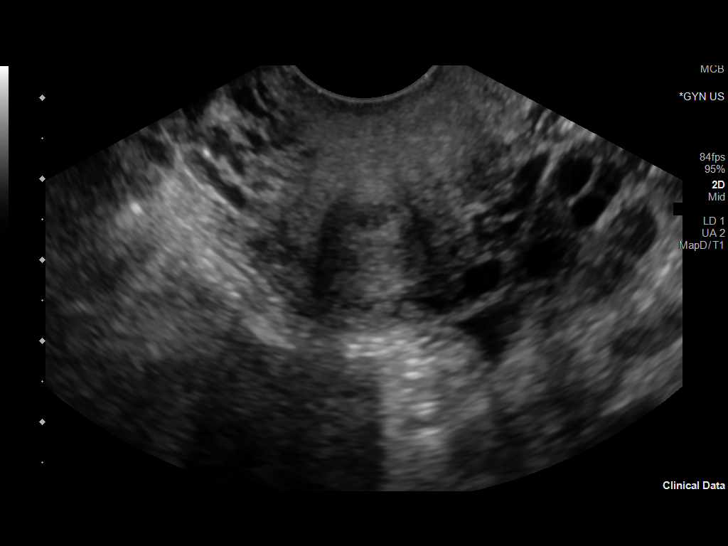
[im 82/123]
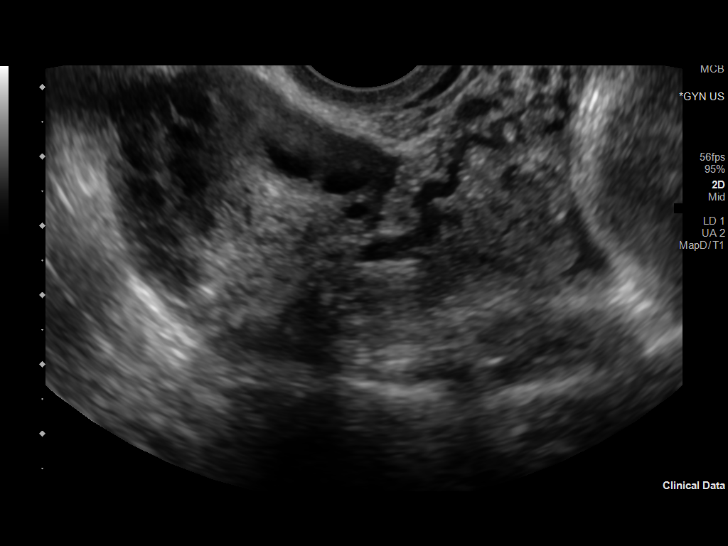
[im 92/123]
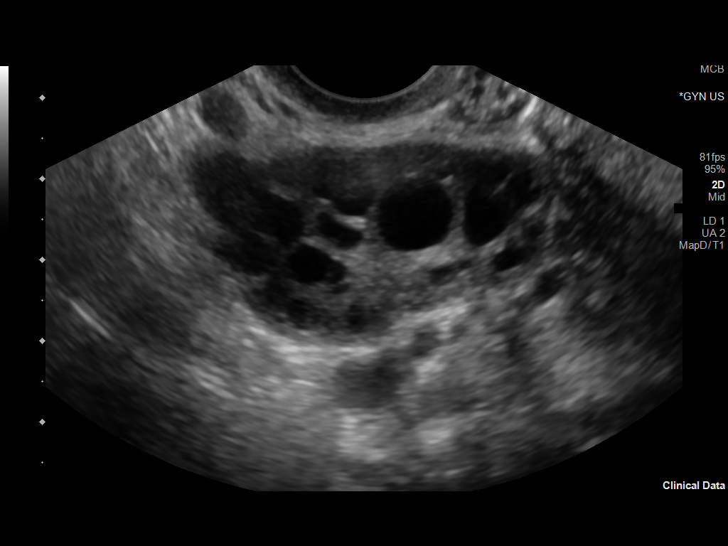
[im 102/123]
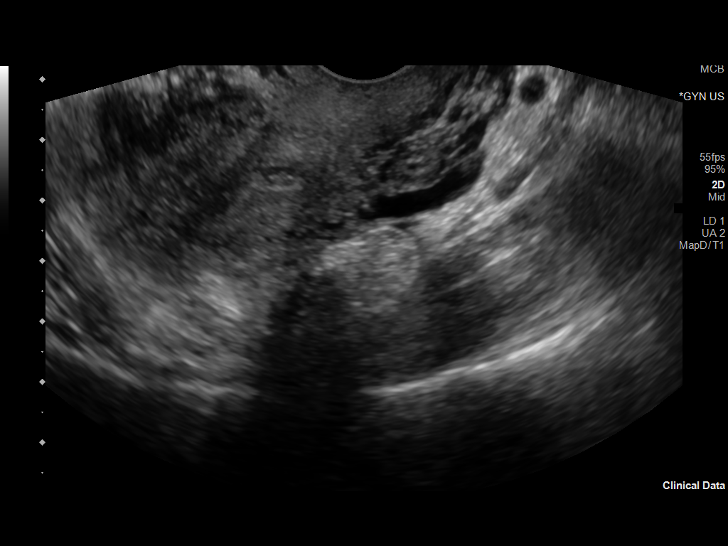
[im 112/123]
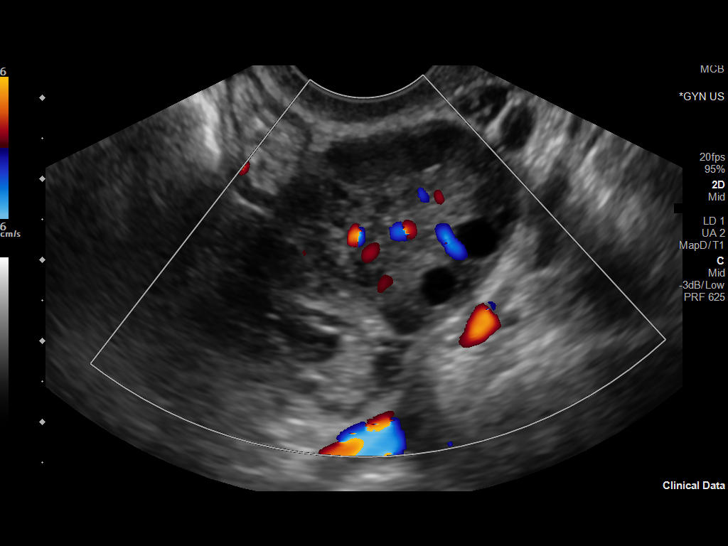
[im 123/123]
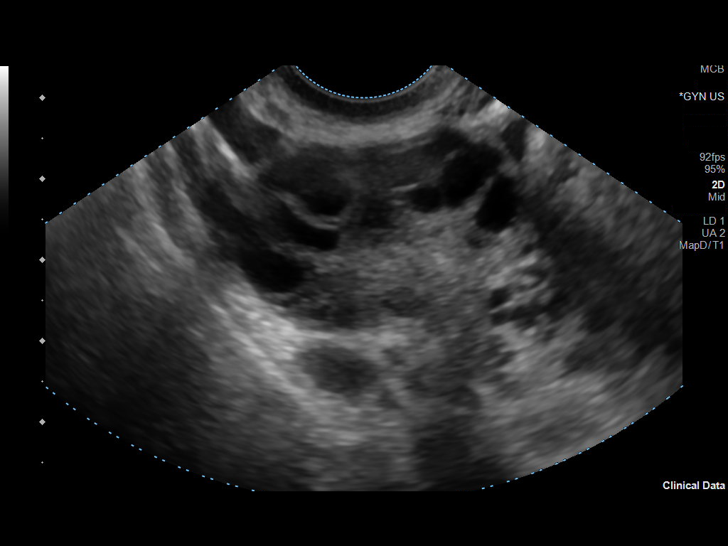

[13 of 25 positions shown; findings below may reference images not displayed]

FINDINGS: Uterus

Measurements: 7.5 x 4.2 x 4.7 cm = volume: 76.8 mL. No fibroids or
other mass visualized.

Endometrium

Thickness: 7.2 mm.  No focal abnormality visualized.

Right ovary

Measurements: 4.3 x 3.1 x 3.2 cm = volume: 22.3 mL. Contains
multiple small peripheral follicles. No suspicious masses.

Left ovary

Measurements: 4.3 x 2.6 x 3.3 cm = volume: 18.6 mL. Contains
multiple small peripheral follicles. No suspicious masses.

Pulsed Doppler evaluation of both ovaries demonstrates normal
low-resistance arterial and venous waveforms.

Other findings

A small amount of physiologic fluid is seen in the pelvis.
IMPRESSION: 1. No torsion identified at the time of today's imaging.
2. Multiple small follicles in the periphery of both ovaries raises
the possibility of PCOS. Recommend clinical correlation.

## 2020-11-06 IMAGING — US US ART/VEN ABD/PELV/SCROTUM DOPPLER LTD
1 series · 13 of 25 positions shown · non-contrast
Comparison: None.

CLINICAL DATA: Rule out torsion. Acute onset left lower quadrant
pain.

EXAM:
TRANSABDOMINAL AND TRANSVAGINAL ULTRASOUND OF PELVIS
DOPPLER ULTRASOUND OF OVARIES
TECHNIQUE: Both transabdominal and transvaginal ultrasound examinations of the
pelvis were performed. Transabdominal technique was performed for
global imaging of the pelvis including uterus, ovaries, adnexal
regions, and pelvic cul-de-sac.
It was necessary to proceed with endovaginal exam following the
transabdominal exam to visualize the endometrium and ovaries. Color
and duplex Doppler ultrasound was utilized to evaluate blood flow to
the ovaries.

[Series 1: us art/ven abd/pelv/scrotum doppler ltd · 123 acquisitions, 13 frames shown]
[im 1/123]
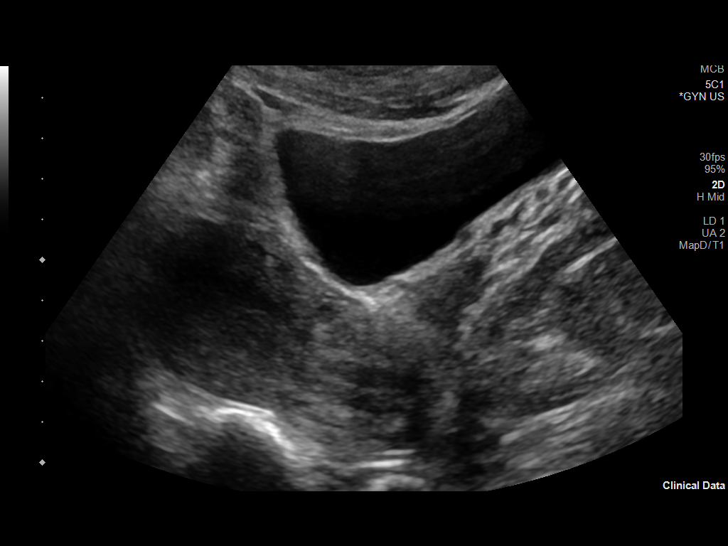
[im 11/123]
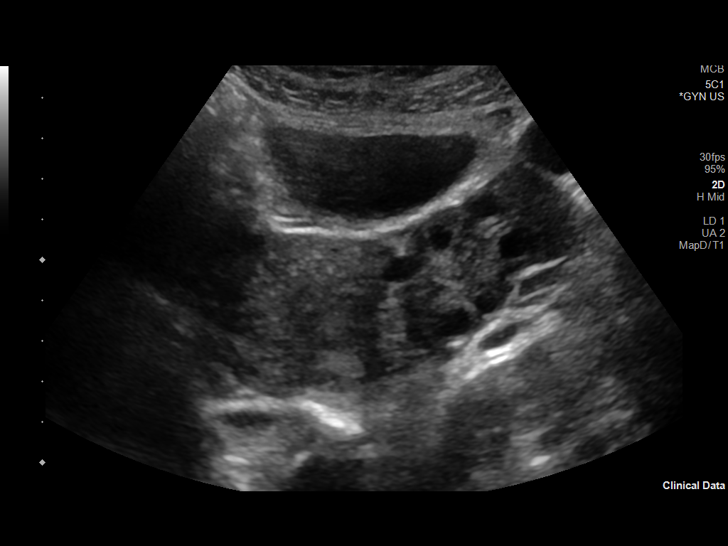
[im 21/123]
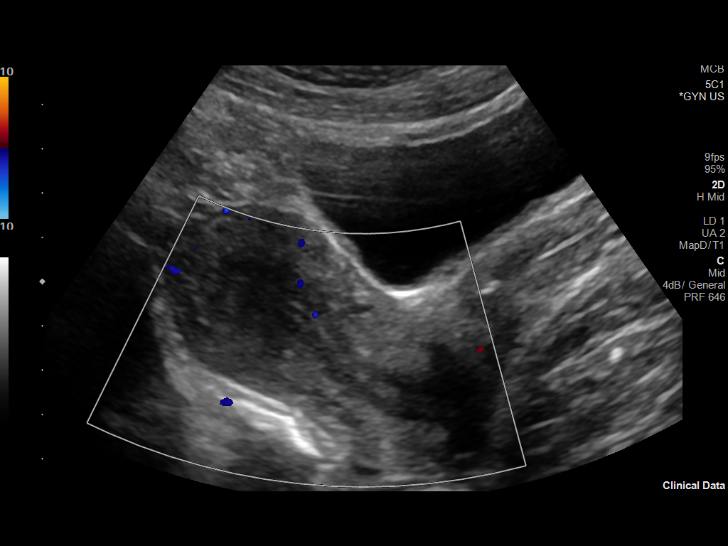
[im 31/123]
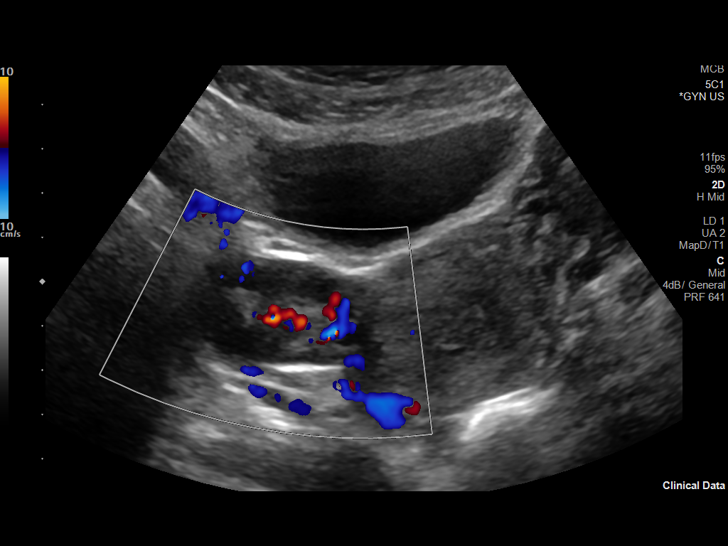
[im 41/123]
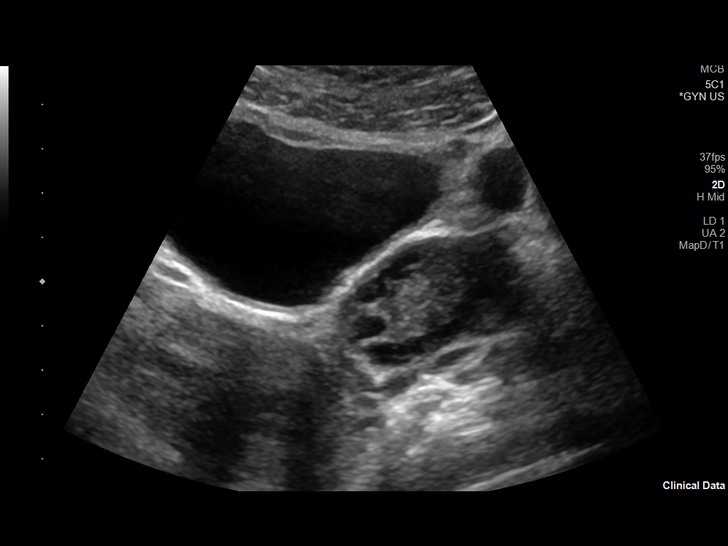
[im 51/123]
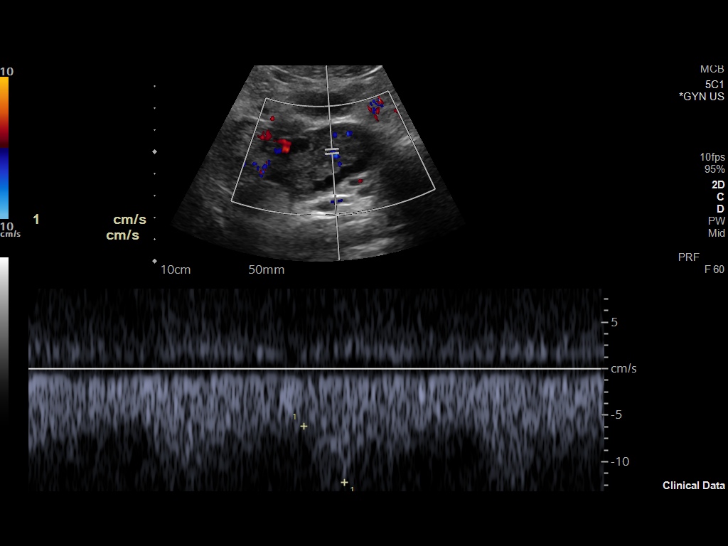
[im 62/123]
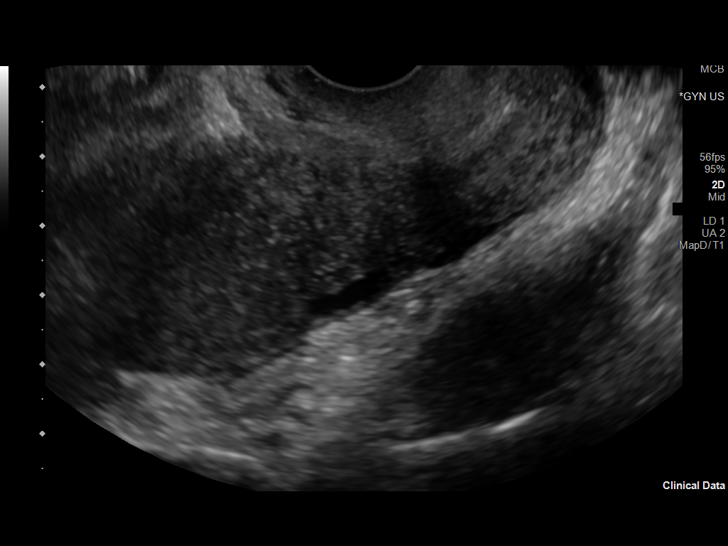
[im 72/123]
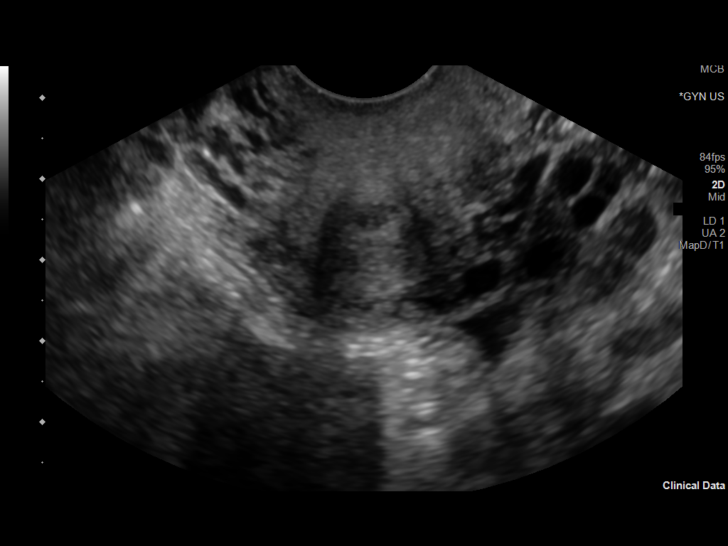
[im 82/123]
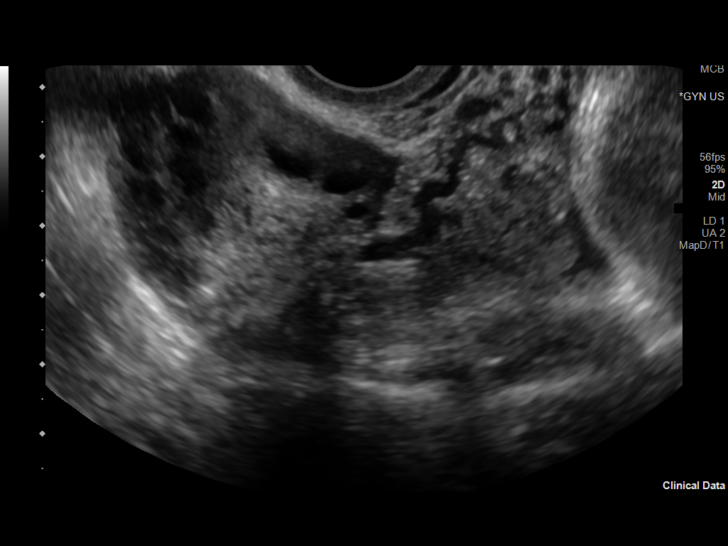
[im 92/123]
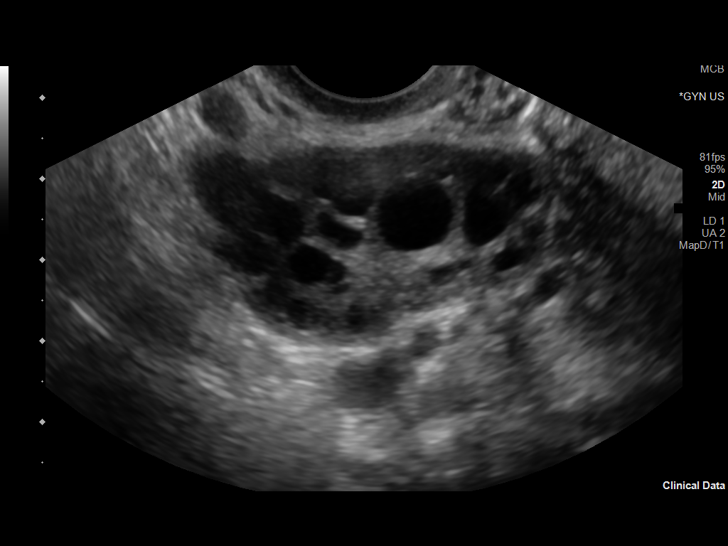
[im 102/123]
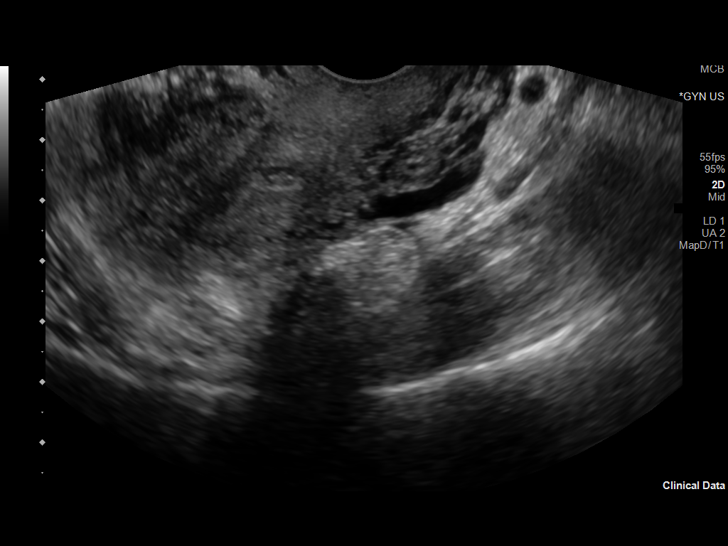
[im 112/123]
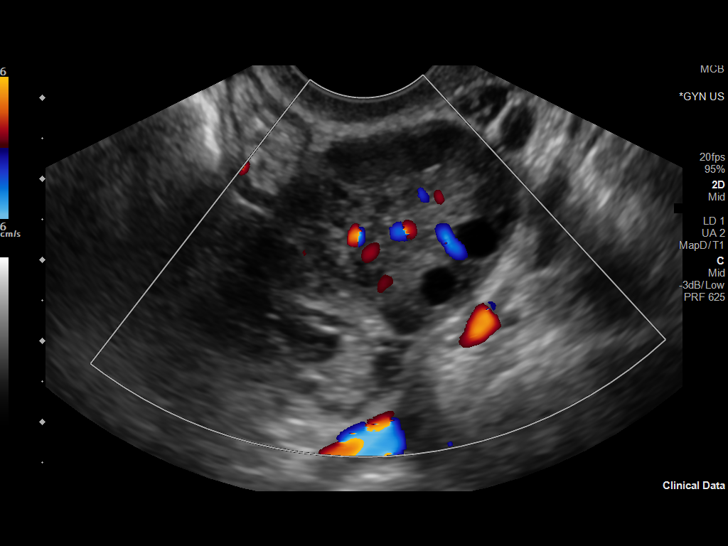
[im 123/123]
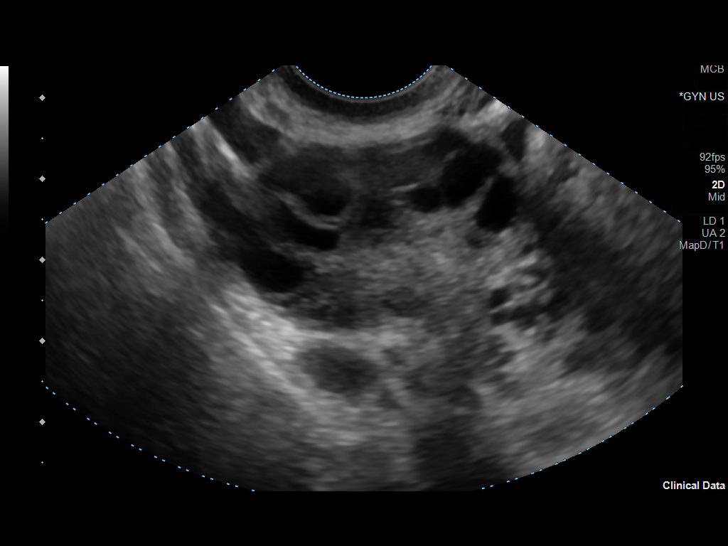

[13 of 25 positions shown; findings below may reference images not displayed]

FINDINGS: Uterus

Measurements: 7.5 x 4.2 x 4.7 cm = volume: 76.8 mL. No fibroids or
other mass visualized.

Endometrium

Thickness: 7.2 mm.  No focal abnormality visualized.

Right ovary

Measurements: 4.3 x 3.1 x 3.2 cm = volume: 22.3 mL. Contains
multiple small peripheral follicles. No suspicious masses.

Left ovary

Measurements: 4.3 x 2.6 x 3.3 cm = volume: 18.6 mL. Contains
multiple small peripheral follicles. No suspicious masses.

Pulsed Doppler evaluation of both ovaries demonstrates normal
low-resistance arterial and venous waveforms.

Other findings

A small amount of physiologic fluid is seen in the pelvis.
IMPRESSION: 1. No torsion identified at the time of today's imaging.
2. Multiple small follicles in the periphery of both ovaries raises
the possibility of PCOS. Recommend clinical correlation.

## 2020-11-06 IMAGING — US US TRANSVAGINAL NON-OB
1 series · 13 of 25 positions shown · non-contrast
Comparison: None.

CLINICAL DATA: Rule out torsion. Acute onset left lower quadrant
pain.

EXAM:
TRANSABDOMINAL AND TRANSVAGINAL ULTRASOUND OF PELVIS
DOPPLER ULTRASOUND OF OVARIES
TECHNIQUE: Both transabdominal and transvaginal ultrasound examinations of the
pelvis were performed. Transabdominal technique was performed for
global imaging of the pelvis including uterus, ovaries, adnexal
regions, and pelvic cul-de-sac.
It was necessary to proceed with endovaginal exam following the
transabdominal exam to visualize the endometrium and ovaries. Color
and duplex Doppler ultrasound was utilized to evaluate blood flow to
the ovaries.

[Series 1: us transvaginal non-ob · 123 acquisitions, 13 frames shown]
[im 1/123]
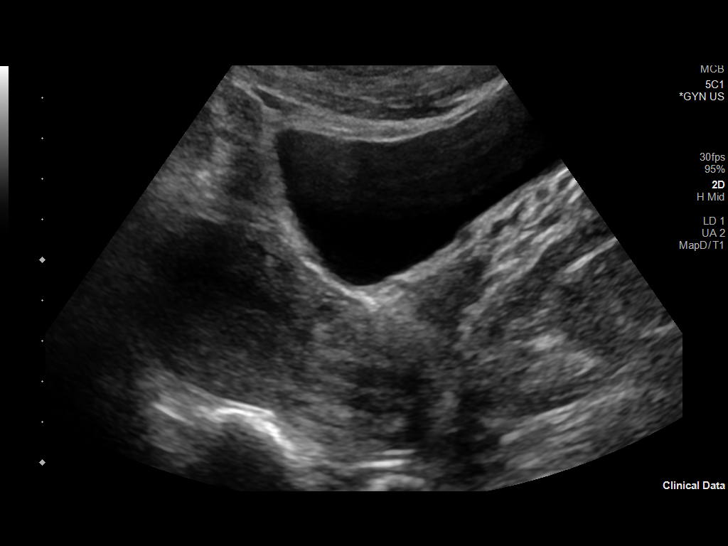
[im 11/123]
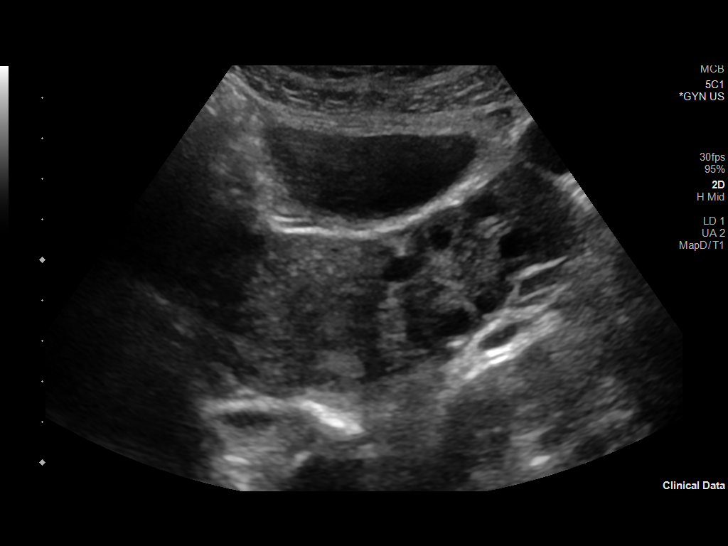
[im 21/123]
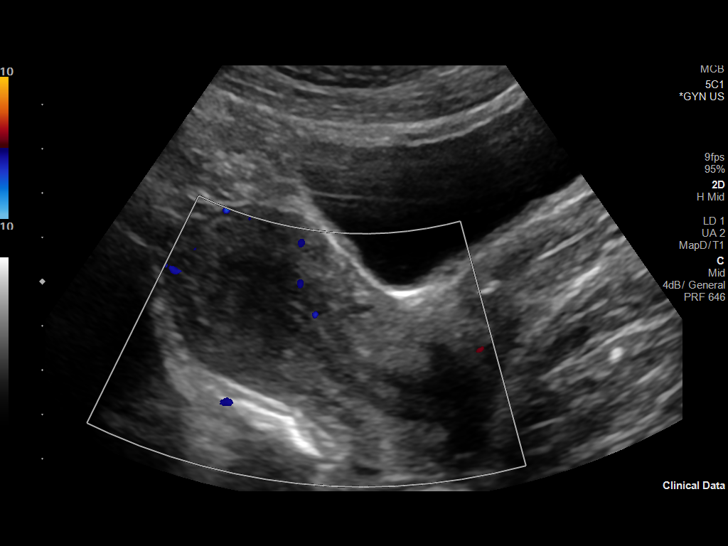
[im 31/123]
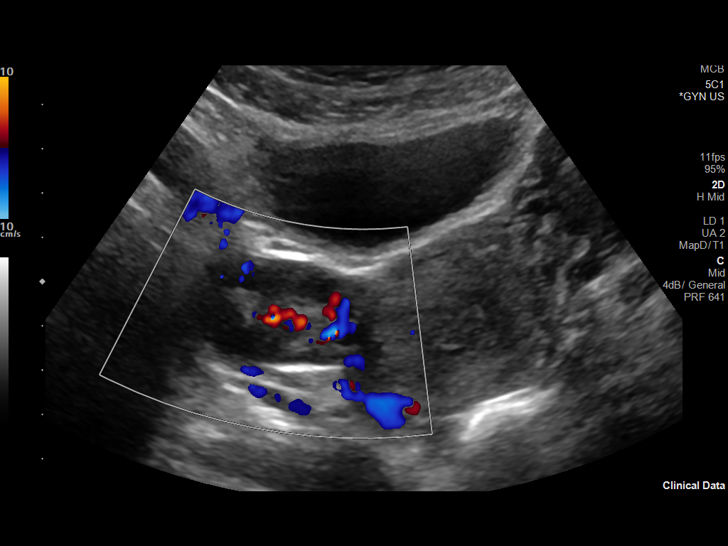
[im 41/123]
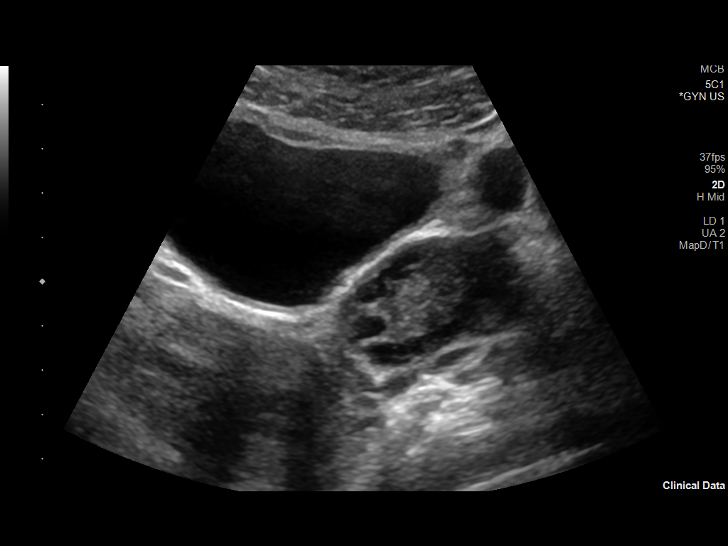
[im 51/123]
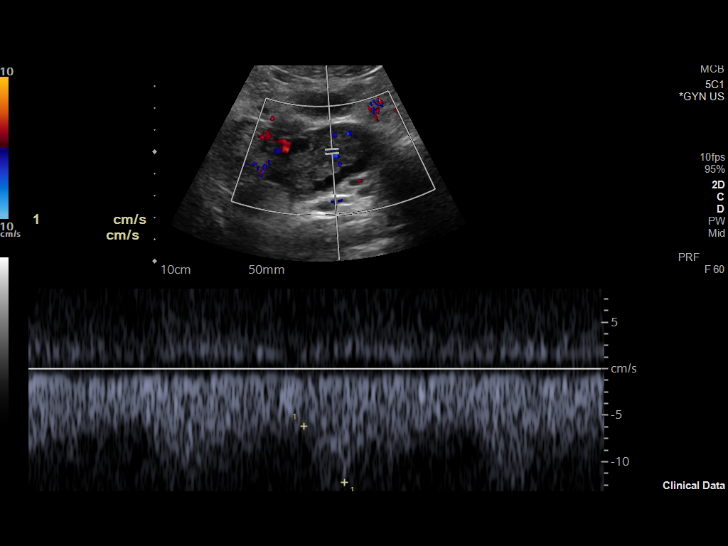
[im 62/123]
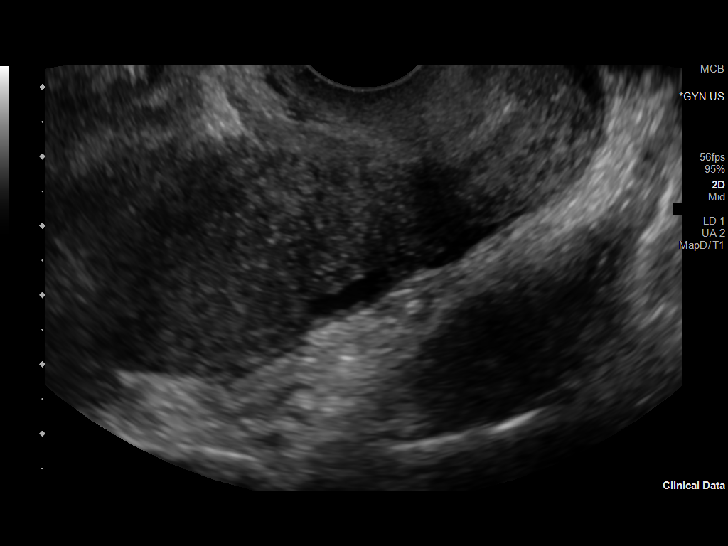
[im 72/123]
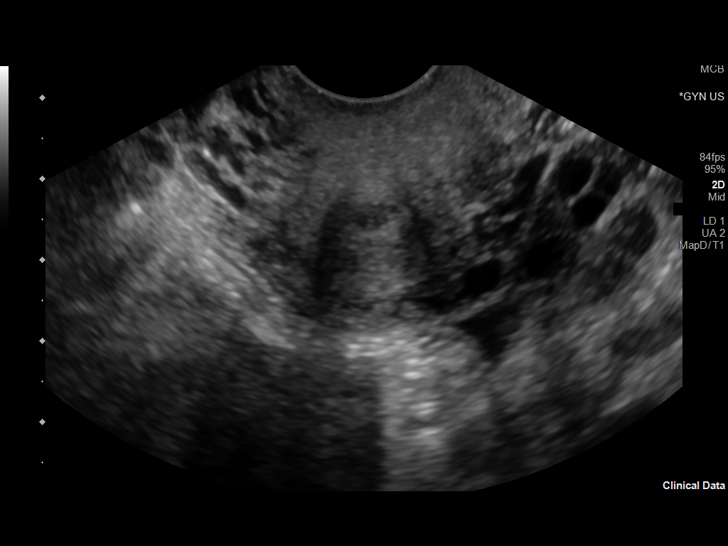
[im 82/123]
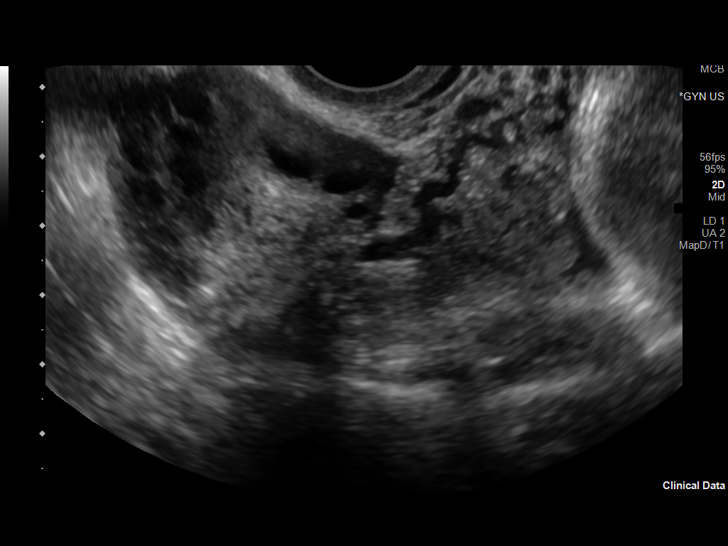
[im 92/123]
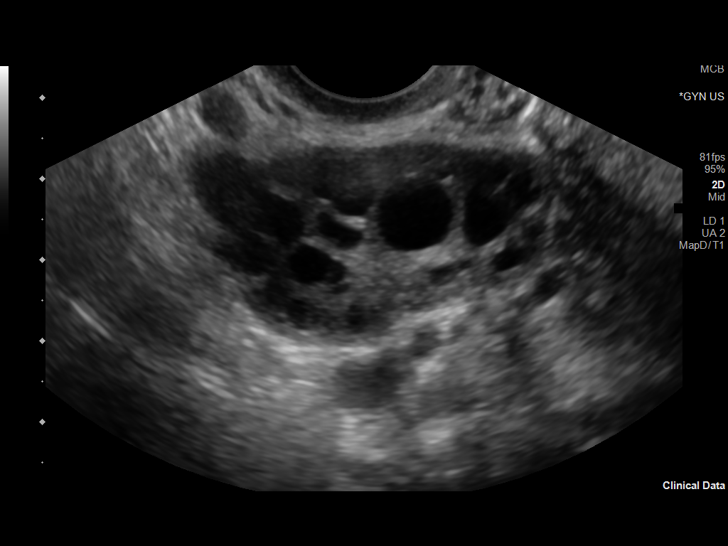
[im 102/123]
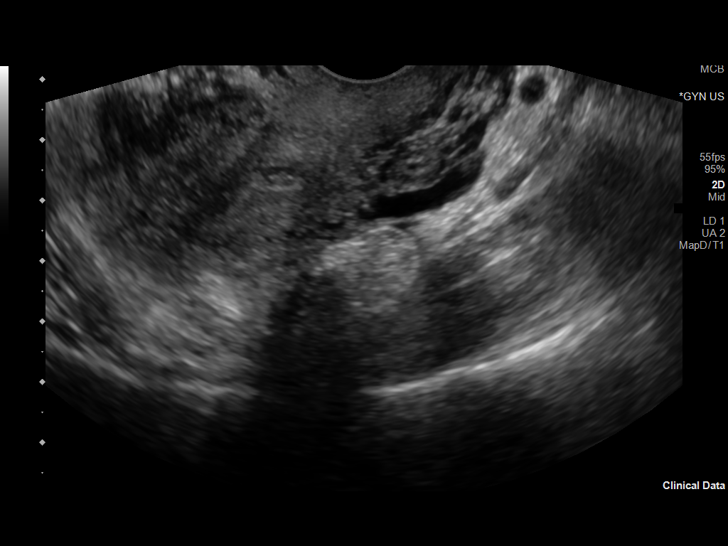
[im 112/123]
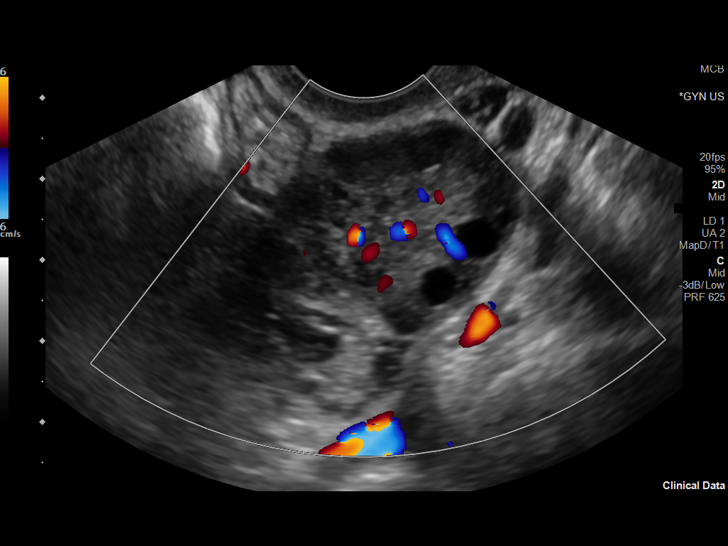
[im 123/123]
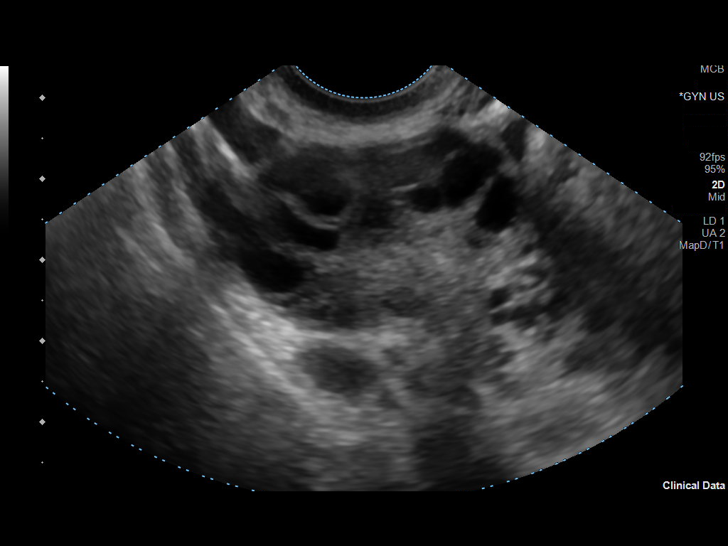

[13 of 25 positions shown; findings below may reference images not displayed]

FINDINGS: Uterus

Measurements: 7.5 x 4.2 x 4.7 cm = volume: 76.8 mL. No fibroids or
other mass visualized.

Endometrium

Thickness: 7.2 mm.  No focal abnormality visualized.

Right ovary

Measurements: 4.3 x 3.1 x 3.2 cm = volume: 22.3 mL. Contains
multiple small peripheral follicles. No suspicious masses.

Left ovary

Measurements: 4.3 x 2.6 x 3.3 cm = volume: 18.6 mL. Contains
multiple small peripheral follicles. No suspicious masses.

Pulsed Doppler evaluation of both ovaries demonstrates normal
low-resistance arterial and venous waveforms.

Other findings

A small amount of physiologic fluid is seen in the pelvis.
IMPRESSION: 1. No torsion identified at the time of today's imaging.
2. Multiple small follicles in the periphery of both ovaries raises
the possibility of PCOS. Recommend clinical correlation.
